# Patient Record
Sex: Female | Born: 1960 | Race: White | Hispanic: No | Marital: Married | Smoking: Former smoker
Health system: Southern US, Community
[De-identification: ages and names within clinical notes are randomized; demographics above are authoritative.]

## PROBLEM LIST (undated history)

## (undated) DIAGNOSIS — I10 Essential (primary) hypertension: Secondary | ICD-10-CM

## (undated) DIAGNOSIS — Z1371 Encounter for nonprocreative screening for genetic disease carrier status: Secondary | ICD-10-CM

## (undated) DIAGNOSIS — R7303 Prediabetes: Secondary | ICD-10-CM

## (undated) DIAGNOSIS — J309 Allergic rhinitis, unspecified: Secondary | ICD-10-CM

## (undated) DIAGNOSIS — Z8041 Family history of malignant neoplasm of ovary: Secondary | ICD-10-CM

## (undated) DIAGNOSIS — J45909 Unspecified asthma, uncomplicated: Secondary | ICD-10-CM

## (undated) HISTORY — DX: Family history of malignant neoplasm of ovary: Z80.41

## (undated) HISTORY — DX: Allergic rhinitis, unspecified: J30.9

## (undated) HISTORY — DX: Unspecified asthma, uncomplicated: J45.909

## (undated) HISTORY — DX: Encounter for nonprocreative screening for genetic disease carrier status: Z13.71

## (undated) HISTORY — DX: Essential (primary) hypertension: I10

---
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
## 1983-06-28 HISTORY — PX: LYMPHADENECTOMY: SHX5960

## 1987-06-28 HISTORY — PX: TMJ ARTHROPLASTY: SHX1066

## 2011-12-09 ENCOUNTER — Ambulatory Visit: Payer: Self-pay | Admitting: Unknown Physician Specialty

## 2014-06-27 DIAGNOSIS — Z1371 Encounter for nonprocreative screening for genetic disease carrier status: Secondary | ICD-10-CM

## 2014-06-27 DIAGNOSIS — Z8041 Family history of malignant neoplasm of ovary: Secondary | ICD-10-CM

## 2014-06-27 HISTORY — DX: Encounter for nonprocreative screening for genetic disease carrier status: Z13.71

## 2014-06-27 HISTORY — DX: Family history of malignant neoplasm of ovary: Z80.41

## 2016-05-18 ENCOUNTER — Other Ambulatory Visit: Payer: Self-pay | Admitting: Obstetrics and Gynecology

## 2016-05-18 DIAGNOSIS — Z1231 Encounter for screening mammogram for malignant neoplasm of breast: Secondary | ICD-10-CM

## 2016-05-31 ENCOUNTER — Encounter: Payer: Self-pay | Admitting: Radiology

## 2016-05-31 ENCOUNTER — Ambulatory Visit
Admission: RE | Admit: 2016-05-31 | Discharge: 2016-05-31 | Disposition: A | Discharge status: Home / Self Care | Payer: Managed Care, Other (non HMO) | Source: Ambulatory Visit | Attending: Obstetrics and Gynecology | Admitting: Obstetrics and Gynecology

## 2016-05-31 DIAGNOSIS — Z1231 Encounter for screening mammogram for malignant neoplasm of breast: Secondary | ICD-10-CM | POA: Diagnosis present

## 2016-06-27 HISTORY — PX: COLONOSCOPY: SHX174

## 2017-06-27 DIAGNOSIS — R7303 Prediabetes: Secondary | ICD-10-CM

## 2017-06-27 HISTORY — DX: Prediabetes: R73.03

## 2017-10-30 ENCOUNTER — Encounter: Payer: Self-pay | Admitting: Obstetrics and Gynecology

## 2017-10-30 ENCOUNTER — Ambulatory Visit (INDEPENDENT_AMBULATORY_CARE_PROVIDER_SITE_OTHER): Payer: Managed Care, Other (non HMO) | Admitting: Obstetrics and Gynecology

## 2017-10-30 VITALS — BP 142/90 | HR 78 | Ht 64.0 in | Wt 197.0 lb

## 2017-10-30 DIAGNOSIS — Z01419 Encounter for gynecological examination (general) (routine) without abnormal findings: Secondary | ICD-10-CM

## 2017-10-30 DIAGNOSIS — Z1239 Encounter for other screening for malignant neoplasm of breast: Secondary | ICD-10-CM

## 2017-10-30 DIAGNOSIS — N952 Postmenopausal atrophic vaginitis: Secondary | ICD-10-CM | POA: Diagnosis not present

## 2017-10-30 DIAGNOSIS — R030 Elevated blood-pressure reading, without diagnosis of hypertension: Secondary | ICD-10-CM

## 2017-10-30 DIAGNOSIS — Z1151 Encounter for screening for human papillomavirus (HPV): Secondary | ICD-10-CM | POA: Diagnosis not present

## 2017-10-30 DIAGNOSIS — Z13220 Encounter for screening for lipoid disorders: Secondary | ICD-10-CM

## 2017-10-30 DIAGNOSIS — Z131 Encounter for screening for diabetes mellitus: Secondary | ICD-10-CM

## 2017-10-30 DIAGNOSIS — Z0000 Encounter for general adult medical examination without abnormal findings: Secondary | ICD-10-CM

## 2017-10-30 DIAGNOSIS — Z124 Encounter for screening for malignant neoplasm of cervix: Secondary | ICD-10-CM

## 2017-10-30 DIAGNOSIS — Z23 Encounter for immunization: Secondary | ICD-10-CM | POA: Diagnosis not present

## 2017-10-30 DIAGNOSIS — I10 Essential (primary) hypertension: Secondary | ICD-10-CM | POA: Insufficient documentation

## 2017-10-30 DIAGNOSIS — Z1231 Encounter for screening mammogram for malignant neoplasm of breast: Secondary | ICD-10-CM | POA: Diagnosis not present

## 2017-10-30 MED ORDER — ESTRADIOL 0.1 MG/GM VA CREA: 1.0000 | TOPICAL_CREAM | Freq: Every day | VAGINAL | 1 refills | Status: DC

## 2017-10-30 NOTE — Patient Instructions (Addendum)
I value your feedback and entrusting us with your care. If you get a [facility] patient survey, I would appreciate you taking the time to let us know about your experience today. Thank you!  [facility]: [phone]  [facility]: [phone]  

## 2017-10-30 NOTE — Progress Notes (Signed)
PCP: System, Pcp Not In   Chief Complaint  Patient presents with  . Gynecologic Exam    HPI:      [name] is a 57 y.o. [phone] who LMP was No LMP recorded. Patient is postmenopausal., presents today for her annual examination.  Her menses are absent due to menopause. She does not have intermenstrual bleeding. She does have vasomotor sx.   Sex activity: single partner, contraception - post menopausal status. She does have vaginal dryness and uses estrace crm with sx improvement.  Last Pap: April 17, 2015  Results were: no abnormalities /neg HPV DNA.  Hx of STDs: none  Last mammogram: May 31, 2016  Results were: normal--routine follow-up in 12 months There is no FH of breast cancer. There is a FH of ovarian cancer in her PGM. Pt is MyRisk neg 2016.  The patient does do self-breast exams.  Colonoscopy: colonoscopy 1 years ago without abnormalities.  Repeat due after 5 years due to hx of polyps in past.  Tobacco use: The patient denies current or previous tobacco use. Alcohol use: social drinker Exercise: moderately active  She does get adequate calcium and Vitamin D in her diet.  Had borderline lipids 11/17. Neg DM screen then. Due for repeat labs but not fasting today.  Has elevated BP today. Takes at home and is in 130s/80s.    Past Medical History:  Diagnosis Date  . Allergic rhinitis   . Asthma   . BRCA negative 2016   MyRisk neg  . Family history of ovarian cancer 2016   MyRisk neg    Past Surgical History:  Procedure Laterality Date  . CESAREAN SECTION  1991/1992  . LYMPHADENECTOMY  1985  . TMJ ARTHROPLASTY  1989    Family History  Problem Relation Age of Onset  . Prostate cancer Father   . Skin cancer Father   . Colon cancer Father   . Skin cancer Brother   . Lung cancer Maternal Grandfather   . Ovarian cancer Paternal Grandmother   . Breast cancer Neg Hx     Social History   Socioeconomic History  . Marital status: Married   Spouse name: Not on file  . Number of children: Not on file  . Years of education: Not on file  . Highest education level: Not on file  Occupational History  . Not on file  Social Needs  . Financial resource strain: Not on file  . Food insecurity:    Worry: Not on file    Inability: Not on file  . Transportation needs:    Medical: Not on file    Non-medical: Not on file  Tobacco Use  . Smoking status: Never Smoker  . Smokeless tobacco: Never Used  Substance and Sexual Activity  . Alcohol use: Yes    Comment: ocasionally   . Drug use: Never  . Sexual activity: Yes    Birth control/protection: Post-menopausal  Lifestyle  . Physical activity:    Days per week: Not on file    Minutes per session: Not on file  . Stress: Not on file  Relationships  . Social connections:    Talks on phone: Not on file    Gets together: Not on file    Attends religious service: Not on file    Active member of club or organization: Not on file    Attends meetings of clubs or organizations: Not on file    Relationship status: Not on file  .  Intimate partner violence:    Fear of current or ex partner: Not on file    Emotionally abused: Not on file    Physically abused: Not on file    Forced sexual activity: Not on file  Other Topics Concern  . Not on file  Social History Narrative  . Not on file    Outpatient Medications Prior to Visit  Medication Sig Dispense Refill  . albuterol (PROVENTIL HFA) 108 (90 Base) MCG/ACT inhaler Inhale into the lungs.     No facility-administered medications prior to visit.     ROS:  Review of Systems  Constitutional: Negative for fatigue, fever and unexpected weight change.  Respiratory: Negative for cough, shortness of breath and wheezing.   Cardiovascular: Negative for chest pain, palpitations and leg swelling.  Gastrointestinal: Negative for blood in stool, constipation, diarrhea, nausea and vomiting.  Endocrine: Negative for cold intolerance, heat  intolerance and polyuria.  Genitourinary: Negative for dyspareunia, dysuria, flank pain, frequency, genital sores, hematuria, menstrual problem, pelvic pain, urgency, vaginal bleeding, vaginal discharge and vaginal pain.  Musculoskeletal: Negative for back pain, joint swelling and myalgias.  Skin: Negative for rash.  Neurological: Negative for dizziness, syncope, light-headedness, numbness and headaches.  Hematological: Negative for adenopathy.  Psychiatric/Behavioral: Negative for agitation, confusion, sleep disturbance and suicidal ideas. The patient is not nervous/anxious.    BREAST: No symptoms    Objective: BP (!) 142/90   Pulse 78   Ht '5\' 4"'  (1.626 m)   Wt 197 lb (89.4 kg)   BMI 33.81 kg/m    Physical Exam  Constitutional: She is oriented to person, place, and time. She appears well-developed and well-nourished.  Genitourinary: Vagina normal and uterus normal. There is no rash or tenderness on the right labia. There is no rash or tenderness on the left labia. No erythema or tenderness in the vagina. No vaginal discharge found. Right adnexum does not display mass and does not display tenderness. Left adnexum does not display mass and does not display tenderness. Cervix does not exhibit motion tenderness or polyp. Uterus is not enlarged or tender.  Neck: Normal range of motion. No thyromegaly present.  Cardiovascular: Normal rate, regular rhythm and normal heart sounds.  No murmur heard. Pulmonary/Chest: Effort normal and breath sounds normal. Right breast exhibits no mass, no nipple discharge, no skin change and no tenderness. Left breast exhibits no mass, no nipple discharge, no skin change and no tenderness.  Abdominal: Soft. There is no tenderness. There is no guarding.  Musculoskeletal: Normal range of motion.  Neurological: She is alert and oriented to person, place, and time. No cranial nerve deficit.  Psychiatric: She has a normal mood and affect. Her behavior is normal.    Vitals reviewed.   Assessment/Plan:  Encounter for annual routine gynecological examination  Cervical cancer screening - Plan: IGP, Aptima HPV  Screening for HPV (human papillomavirus) - Plan: IGP, Aptima HPV  Screening for breast cancer - Pt to sched mammo - Plan: MM DIGITAL SCREENING BILATERAL  Postmenopausal atrophic vaginitis - Rx RF estrace crm.  - Plan: estradiol (ESTRACE) 0.1 MG/GM vaginal cream  Need for Tdap vaccination - Plan: Tdap vaccine greater than or equal to 7yo IM  Blood tests for routine general physical examination - Will call wiht results. - Plan: Comprehensive metabolic panel, Lipid panel, Hemoglobin A1c  Screening cholesterol level - Plan: Lipid panel  Screening for diabetes mellitus - Plan: Hemoglobin A1c  Elevated blood pressure reading in office without diagnosis of hypertension - Rechk when  RTO for labs. No hx of HTN.    Meds ordered this encounter  Medications  . estradiol (ESTRACE) 0.1 MG/GM vaginal cream    Sig: Place 1 Applicatorful vaginally at bedtime. Insert 1g nightly for 1 wk, then 1 g once weekly as maintenace    Dispense:  42.5 g    Refill:  1    Order Specific Question:   Supervising Provider    Answer:   Gae Dry [[id number]]           GYN counsel breast self exam, mammography screening, menopause, adequate intake of calcium and vitamin D, diet and exercise    F/U  Return in about 1 year (around 10/31/2018).  Alicia B. Copland, PA-C 10/30/2017 12:15 PM

## 2017-11-02 ENCOUNTER — Other Ambulatory Visit (INDEPENDENT_AMBULATORY_CARE_PROVIDER_SITE_OTHER): Payer: Managed Care, Other (non HMO)

## 2017-11-02 VITALS — BP 160/100

## 2017-11-02 DIAGNOSIS — Z131 Encounter for screening for diabetes mellitus: Secondary | ICD-10-CM

## 2017-11-02 DIAGNOSIS — Z0000 Encounter for general adult medical examination without abnormal findings: Secondary | ICD-10-CM

## 2017-11-02 DIAGNOSIS — R030 Elevated blood-pressure reading, without diagnosis of hypertension: Secondary | ICD-10-CM

## 2017-11-02 DIAGNOSIS — Z13220 Encounter for screening for lipoid disorders: Secondary | ICD-10-CM

## 2017-11-02 LAB — IGP, APTIMA HPV
HPV Aptima: NEGATIVE
PAP SMEAR COMMENT: 0

## 2017-11-02 NOTE — Progress Notes (Signed)
    System, Pcp Not In   Chief Complaint  Patient presents with  . Follow-up    BP recheck with nurse at lab draw    HPI:      [name] is a 57 y.o. [phone] who LMP was No LMP recorded. Patient is postmenopausal., presents today for BP rechk with nurse due to elevated reading at 10/30/17 appt, no hx of HTN. Pt states she didn't sleep well last night.  10/30/17: BP 142/90  OBJECTIVE:   Vitals:  BP (!) 160/100    Assessment/Plan: Elevated blood pressure reading in office without diagnosis of hypertension - Level increase but pt didn't sleep last night. RTO in 1 wk for rechk. Keep BPs at home.   Blood tests for routine general physical examination - Will call wiht results. - Plan: Comprehensive metabolic panel, Lipid panel, Hemoglobin A1c  Screening cholesterol level - Plan: Lipid panel  Screening for diabetes mellitus - Plan: Hemoglobin A1c    Return in about 1 week (around 11/09/2017) for BP check with me.  Diahann Guajardo B. Falcon Mccaskey, PA-C 11/02/2017 10:38 AM

## 2017-11-02 NOTE — Patient Instructions (Signed)
I value your feedback and entrusting us with your care. If you get a [facility] patient survey, I would appreciate you taking the time to let us know about your experience today. Thank you! 

## 2017-11-03 ENCOUNTER — Encounter: Payer: Self-pay | Admitting: Obstetrics and Gynecology

## 2017-11-03 LAB — COMPREHENSIVE METABOLIC PANEL
ALK PHOS: 83 IU/L (ref 39–117)
ALT: 20 IU/L (ref 0–32)
AST: 20 IU/L (ref 0–40)
Albumin/Globulin Ratio: 2.4 — ABNORMAL HIGH (ref 1.2–2.2)
Albumin: 4.6 g/dL (ref 3.5–5.5)
BILIRUBIN TOTAL: 0.4 mg/dL (ref 0.0–1.2)
BUN/Creatinine Ratio: 17 (ref 9–23)
BUN: 13 mg/dL (ref 6–24)
CHLORIDE: 103 mmol/L (ref 96–106)
CO2: 21 mmol/L (ref 20–29)
CREATININE: 0.78 mg/dL (ref 0.57–1.00)
Calcium: 9.3 mg/dL (ref 8.7–10.2)
GFR calc Af Amer: 98 mL/min/{1.73_m2} (ref >59)
GFR calc non Af Amer: 85 mL/min/{1.73_m2} (ref >59)
GLUCOSE: 102 mg/dL — AB (ref 65–99)
Globulin, Total: 1.9 g/dL (ref 1.5–4.5)
Potassium: 4.5 mmol/L (ref 3.5–5.2)
Sodium: 142 mmol/L (ref 134–144)
Total Protein: 6.5 g/dL (ref 6.0–8.5)

## 2017-11-03 LAB — LIPID PANEL
CHOLESTEROL TOTAL: 206 mg/dL — AB (ref 100–199)
Chol/HDL Ratio: 3.9 ratio (ref 0.0–4.4)
HDL: 53 mg/dL (ref >39)
LDL Calculated: 133 mg/dL — ABNORMAL HIGH (ref 0–99)
TRIGLYCERIDES: 99 mg/dL (ref 0–149)
VLDL CHOLESTEROL CAL: 20 mg/dL (ref 5–40)

## 2017-11-03 LAB — HEMOGLOBIN A1C
Est. average glucose Bld gHb Est-mCnc: 117 mg/dL
Hgb A1c MFr Bld: 5.7 % — ABNORMAL HIGH (ref 4.8–5.6)

## 2017-11-07 ENCOUNTER — Encounter: Payer: Self-pay | Admitting: Obstetrics and Gynecology

## 2017-11-07 ENCOUNTER — Ambulatory Visit (INDEPENDENT_AMBULATORY_CARE_PROVIDER_SITE_OTHER): Payer: Managed Care, Other (non HMO) | Admitting: Obstetrics and Gynecology

## 2017-11-07 VITALS — BP 156/96 | HR 72 | Ht 64.0 in | Wt 197.0 lb

## 2017-11-07 DIAGNOSIS — I10 Essential (primary) hypertension: Secondary | ICD-10-CM | POA: Insufficient documentation

## 2017-11-07 MED ORDER — LISINOPRIL 10 MG PO TABS: 10.0000 mg | ORAL_TABLET | Freq: Every day | ORAL | 0 refills | Status: DC

## 2017-11-07 NOTE — Progress Notes (Signed)
System, Pcp Not In   Chief Complaint  Patient presents with  . Follow-up    blood pressure ck    HPI:      [name] is a 57 y.o. [phone] who LMP was No LMP recorded. Patient is postmenopausal., presents today for BP check. BP was 142/90 at 10/30/17 annual. Pt had rechk with lab draw 11/02/17 and it was 160/100 (pt hadn't slept well night before). Pt has been taking BPs at home and states machine gives her different readings but it has been "high" (138-155/76-83 yesterday, 160s/100 other times). Pt has gone to decaf coffee. FH HTN. Pt denies SOB, CP, headaches, vision changes.   Past Medical History:  Diagnosis Date  . Allergic rhinitis   . Asthma   . BRCA negative 2016   MyRisk neg  . Family history of ovarian cancer 2016   MyRisk neg    Past Surgical History:  Procedure Laterality Date  . CESAREAN SECTION  1991/1992  . LYMPHADENECTOMY  1985  . TMJ ARTHROPLASTY  1989    Family History  Problem Relation Age of Onset  . Prostate cancer Father   . Skin cancer Father   . Colon cancer Father   . Hypertension Father   . Skin cancer Brother   . Lung cancer Maternal Grandfather   . Ovarian cancer Paternal Grandmother   . Hypertension Mother   . Hypertension Sister   . Breast cancer Neg Hx     Social History   Socioeconomic History  . Marital status: Married    Spouse name: Not on file  . Number of children: Not on file  . Years of education: Not on file  . Highest education level: Not on file  Occupational History  . Not on file  Social Needs  . Financial resource strain: Not on file  . Food insecurity:    Worry: Not on file    Inability: Not on file  . Transportation needs:    Medical: Not on file    Non-medical: Not on file  Tobacco Use  . Smoking status: Former Research scientist (life sciences)  . Smokeless tobacco: Never Used  . Tobacco comment: quit ~ 2000  Substance and Sexual Activity  . Alcohol use: Yes    Comment: ocasionally   . Drug use: Never  . Sexual  activity: Yes    Birth control/protection: Post-menopausal  Lifestyle  . Physical activity:    Days per week: Not on file    Minutes per session: Not on file  . Stress: Not on file  Relationships  . Social connections:    Talks on phone: Not on file    Gets together: Not on file    Attends religious service: Not on file    Active member of club or organization: Not on file    Attends meetings of clubs or organizations: Not on file    Relationship status: Not on file  . Intimate partner violence:    Fear of current or ex partner: Not on file    Emotionally abused: Not on file    Physically abused: Not on file    Forced sexual activity: Not on file  Other Topics Concern  . Not on file  Social History Narrative  . Not on file    Outpatient Medications Prior to Visit  Medication Sig Dispense Refill  . albuterol (PROVENTIL HFA) 108 (90 Base) MCG/ACT inhaler Inhale into the lungs.    Marland Kitchen estradiol (ESTRACE) 0.1 MG/GM vaginal cream  Place 1 Applicatorful vaginally at bedtime. Insert 1g nightly for 1 wk, then 1 g once weekly as maintenace 42.5 g 1   No facility-administered medications prior to visit.      ROS:  Review of Systems  Constitutional: Negative for fever.  Gastrointestinal: Negative for blood in stool, constipation, diarrhea, nausea and vomiting.  Genitourinary: Negative for dyspareunia, dysuria, flank pain, frequency, hematuria, urgency, vaginal bleeding, vaginal discharge and vaginal pain.  Musculoskeletal: Negative for back pain.  Skin: Negative for rash.  Neurological: Negative for dizziness, light-headedness and headaches.   BREAST: No symptoms   OBJECTIVE:   Vitals:  BP (!) 156/96   Pulse 72   Ht '5\' 4"'  (1.626 m)   Wt 197 lb (89.4 kg)   BMI 33.81 kg/m   Physical Exam  Constitutional: She is oriented to person, place, and time. She appears well-developed.  Neck: Normal range of motion.  Pulmonary/Chest: Effort normal.  Musculoskeletal: Normal range of  motion.  Neurological: She is alert and oriented to person, place, and time. No cranial nerve deficit.  Psychiatric: She has a normal mood and affect. Her behavior is normal. Judgment and thought content normal.   Assessment/Plan: Essential hypertension - Start lisinopril. Side effects/ACEI cough discussed. RTO in 4 wks for BP chck/CMP. Chk BPs at home. Diet/exercise/wt loss/DASH diet changes.  - Plan: lisinopril (PRINIVIL) 10 MG tablet   Meds ordered this encounter  Medications  . lisinopril (PRINIVIL) 10 MG tablet    Sig: Take 1 tablet (10 mg total) by mouth daily.    Dispense:  30 tablet    Refill:  0    Order Specific Question:   Supervising Provider    Answer:   Gae Dry [[id number]]     Return in about 1 month (around 12/05/2017) for BP f/u.  Peirce Deveney B. Amiylah Anastos, PA-C 11/07/2017 11:11 AM

## 2017-11-07 NOTE — Patient Instructions (Signed)
I value your feedback and entrusting us with your care. If you get a [facility] patient survey, I would appreciate you taking the time to let us know about your experience today. Thank you! 

## 2017-11-29 ENCOUNTER — Other Ambulatory Visit: Payer: Self-pay | Admitting: Obstetrics and Gynecology

## 2017-11-29 DIAGNOSIS — I10 Essential (primary) hypertension: Secondary | ICD-10-CM

## 2017-11-29 NOTE — Telephone Encounter (Signed)
Please advise. Thank you

## 2017-12-05 ENCOUNTER — Encounter: Payer: Self-pay | Admitting: Obstetrics and Gynecology

## 2017-12-05 ENCOUNTER — Ambulatory Visit (INDEPENDENT_AMBULATORY_CARE_PROVIDER_SITE_OTHER): Payer: Managed Care, Other (non HMO) | Admitting: Obstetrics and Gynecology

## 2017-12-05 VITALS — BP 140/80 | Ht 64.0 in | Wt 198.0 lb

## 2017-12-05 DIAGNOSIS — I10 Essential (primary) hypertension: Secondary | ICD-10-CM | POA: Diagnosis not present

## 2017-12-05 MED ORDER — LISINOPRIL 10 MG PO TABS: 10.0000 mg | ORAL_TABLET | Freq: Every day | ORAL | 0 refills | Status: DC

## 2017-12-05 NOTE — Progress Notes (Signed)
System, Pcp Not In   Chief Complaint  Patient presents with  . Blood Pressure Check    HPI:      Lauren Vasquez is a 57 y.o. [phone] who LMP was No LMP recorded. Patient is postmenopausal., presents today for BP check. Diagnosed with HTN 5/19 after 2 visits with elevated BP. Started on lisinopril 10 mg about a month ago. Doing well. No side effects except occas feels dizzy. No ACEI cough. Taking BP at home and it's 120-145/70-85.  Pt lives in [place] and is going back in a couple wks. Doesn't have provider there but could see someone there. Gets meds in [place] and mailed to her.    Past Medical History:  Diagnosis Date  . Allergic rhinitis   . Asthma   . BRCA negative 2016   MyRisk neg  . Family history of ovarian cancer 2016   MyRisk neg    Past Surgical History:  Procedure Laterality Date  . CESAREAN SECTION  1991/1992  . LYMPHADENECTOMY  1985  . TMJ ARTHROPLASTY  1989    Family History  Problem Relation Age of Onset  . Prostate cancer Father   . Skin cancer Father   . Colon cancer Father   . Hypertension Father   . Skin cancer Brother   . Lung cancer Maternal Grandfather   . Ovarian cancer Paternal Grandmother   . Hypertension Mother   . Hypertension Sister   . Breast cancer Neg Hx     Social History   Socioeconomic History  . Marital status: Married    Spouse name: Not on file  . Number of children: Not on file  . Years of education: Not on file  . Highest education level: Not on file  Occupational History  . Not on file  Social Needs  . Financial resource strain: Not on file  . Food insecurity:    Worry: Not on file    Inability: Not on file  . Transportation needs:    Medical: Not on file    Non-medical: Not on file  Tobacco Use  . Smoking status: Former Research scientist (life sciences)  . Smokeless tobacco: Never Used  . Tobacco comment: quit ~ 2000  Substance and Sexual Activity  . Alcohol use: Yes    Comment: ocasionally   . Drug use: Never  . Sexual activity:  Yes    Birth control/protection: Post-menopausal  Lifestyle  . Physical activity:    Days per week: Not on file    Minutes per session: Not on file  . Stress: Not on file  Relationships  . Social connections:    Talks on phone: Not on file    Gets together: Not on file    Attends religious service: Not on file    Active member of club or organization: Not on file    Attends meetings of clubs or organizations: Not on file    Relationship status: Not on file  . Intimate partner violence:    Fear of current or ex partner: Not on file    Emotionally abused: Not on file    Physically abused: Not on file    Forced sexual activity: Not on file  Other Topics Concern  . Not on file  Social History Narrative  . Not on file    Outpatient Medications Prior to Visit  Medication Sig Dispense Refill  . albuterol (PROVENTIL HFA) 108 (90 Base) MCG/ACT inhaler Inhale into the lungs.    Marland Kitchen estradiol (ESTRACE) 0.1  MG/GM vaginal cream Place 1 Applicatorful vaginally at bedtime. Insert 1g nightly for 1 wk, then 1 g once weekly as maintenace 42.5 g 1  . lisinopril (PRINIVIL,ZESTRIL) 10 MG tablet TAKE 1 TABLET BY MOUTH EVERY DAY 30 tablet 0   No facility-administered medications prior to visit.     ROS:  Review of Systems  Constitutional: Negative for fever.  Gastrointestinal: Negative for blood in stool, constipation, diarrhea, nausea and vomiting.  Genitourinary: Negative for dyspareunia, dysuria, flank pain, frequency, hematuria, urgency, vaginal bleeding, vaginal discharge and vaginal pain.  Musculoskeletal: Negative for back pain.  Skin: Negative for rash.  Neurological: Positive for dizziness. Negative for syncope, numbness and headaches.    OBJECTIVE:   Vitals:  BP 140/80   Ht '5\' 4"'  (1.626 m)   Wt 198 lb (89.8 kg)   BMI 33.99 kg/m   Physical Exam  Constitutional: She is oriented to person, place, and time. She appears well-developed.  Neck: Normal range of motion.    Pulmonary/Chest: Effort normal.  Musculoskeletal: Normal range of motion.  Neurological: She is alert and oriented to person, place, and time. No cranial nerve deficit.  Psychiatric: She has a normal mood and affect. Her behavior is normal. Judgment and thought content normal.  Vitals reviewed.   Assessment/Plan: Essential hypertension - Plan: Comprehensive metabolic panel, lisinopril (PRINIVIL,ZESTRIL) 10 MG tablet  BP improved on lisinopril. Given improved readings and pt going back to [place] in a couple wks, cont current dose. Pt to take BPs at home and f/u with me re: readings in a couple wks. If still elevated, will increase dose. Rx RF lisinopril. CMP today.   Meds ordered this encounter  Medications  . lisinopril (PRINIVIL,ZESTRIL) 10 MG tablet    Sig: Take 1 tablet (10 mg total) by mouth daily.    Dispense:  90 tablet    Refill:  0    Order Specific Question:   Supervising Provider    Answer:   Gae Dry [[id number]]      Return if symptoms worsen or fail to improve.  Alicia B. Copland, PA-C 12/05/2017 2:53 PM

## 2017-12-05 NOTE — Patient Instructions (Signed)
I value your feedback and entrusting us with your care. If you get a [facility] patient survey, I would appreciate you taking the time to let us know about your experience today. Thank you! 

## 2017-12-06 LAB — COMPREHENSIVE METABOLIC PANEL
ALT: 23 IU/L (ref 0–32)
AST: 19 IU/L (ref 0–40)
Albumin/Globulin Ratio: 2.2 (ref 1.2–2.2)
Albumin: 4.6 g/dL (ref 3.5–5.5)
Alkaline Phosphatase: 78 IU/L (ref 39–117)
BILIRUBIN TOTAL: 0.4 mg/dL (ref 0.0–1.2)
BUN/Creatinine Ratio: 18 (ref 9–23)
BUN: 13 mg/dL (ref 6–24)
CO2: 24 mmol/L (ref 20–29)
Calcium: 9.3 mg/dL (ref 8.7–10.2)
Chloride: 102 mmol/L (ref 96–106)
Creatinine, Ser: 0.72 mg/dL (ref 0.57–1.00)
GFR calc non Af Amer: 94 mL/min/{1.73_m2} (ref >59)
GFR, EST AFRICAN AMERICAN: 108 mL/min/{1.73_m2} (ref >59)
Globulin, Total: 2.1 g/dL (ref 1.5–4.5)
Glucose: 117 mg/dL — ABNORMAL HIGH (ref 65–99)
Potassium: 4 mmol/L (ref 3.5–5.2)
Sodium: 142 mmol/L (ref 134–144)
TOTAL PROTEIN: 6.7 g/dL (ref 6.0–8.5)

## 2018-03-15 ENCOUNTER — Encounter: Payer: Self-pay | Admitting: Obstetrics and Gynecology

## 2018-03-20 ENCOUNTER — Other Ambulatory Visit: Payer: Self-pay | Admitting: Obstetrics and Gynecology

## 2018-03-20 DIAGNOSIS — I10 Essential (primary) hypertension: Secondary | ICD-10-CM

## 2018-03-20 NOTE — Telephone Encounter (Signed)
Please advise 

## 2018-03-29 ENCOUNTER — Encounter: Payer: Self-pay | Admitting: Obstetrics and Gynecology

## 2018-04-02 ENCOUNTER — Other Ambulatory Visit: Payer: Self-pay | Admitting: Obstetrics and Gynecology

## 2018-04-02 DIAGNOSIS — I10 Essential (primary) hypertension: Secondary | ICD-10-CM

## 2018-04-02 MED ORDER — LISINOPRIL 10 MG PO TABS: 10.0000 mg | ORAL_TABLET | Freq: Every day | ORAL | 1 refills | Status: DC

## 2018-04-02 NOTE — Progress Notes (Signed)
Rx RF lisinopril. Bps in 130s/80s.

## 2018-04-09 ENCOUNTER — Encounter: Payer: Self-pay | Admitting: Primary Care

## 2018-04-09 ENCOUNTER — Ambulatory Visit (INDEPENDENT_AMBULATORY_CARE_PROVIDER_SITE_OTHER): Payer: Managed Care, Other (non HMO) | Admitting: Primary Care

## 2018-04-09 VITALS — BP 122/80 | HR 82 | Temp 98.2°F | Ht 63.5 in | Wt 198.0 lb

## 2018-04-09 DIAGNOSIS — J4520 Mild intermittent asthma, uncomplicated: Secondary | ICD-10-CM

## 2018-04-09 DIAGNOSIS — J45909 Unspecified asthma, uncomplicated: Secondary | ICD-10-CM | POA: Insufficient documentation

## 2018-04-09 DIAGNOSIS — R7303 Prediabetes: Secondary | ICD-10-CM | POA: Insufficient documentation

## 2018-04-09 DIAGNOSIS — N952 Postmenopausal atrophic vaginitis: Secondary | ICD-10-CM | POA: Diagnosis not present

## 2018-04-09 DIAGNOSIS — M79673 Pain in unspecified foot: Secondary | ICD-10-CM | POA: Insufficient documentation

## 2018-04-09 DIAGNOSIS — I10 Essential (primary) hypertension: Secondary | ICD-10-CM | POA: Diagnosis not present

## 2018-04-09 DIAGNOSIS — M79672 Pain in left foot: Secondary | ICD-10-CM

## 2018-04-09 LAB — POCT GLYCOSYLATED HEMOGLOBIN (HGB A1C): Hemoglobin A1C: 5.6 % (ref 4.0–5.6)

## 2018-04-09 NOTE — Assessment & Plan Note (Signed)
Managed on Estrace cream and doing well, continue same.

## 2018-04-09 NOTE — Assessment & Plan Note (Signed)
Noted on labs from May 2019, repeat A1C pending. Discussed to work on diet and regular exercise.

## 2018-04-09 NOTE — Assessment & Plan Note (Signed)
Infrequent use of albuterol inhaler. No wheezing on exam.  Continue to monitor. 

## 2018-04-09 NOTE — Assessment & Plan Note (Signed)
Stable in the office today, continue lisinopril. BMP from June 2019 stable.

## 2018-04-09 NOTE — Patient Instructions (Signed)
Stop by the lab prior to leaving today. I will notify you of your results once received.   Start exercising. You should be getting 150 minutes of moderate intensity exercise weekly.  It's important to improve your diet by reducing consumption of fast food, fried food, processed snack foods, sugary drinks. Increase consumption of fresh vegetables and fruits, whole grains, water.  Ensure you are drinking 64 ounces of water daily.  It was a pleasure to meet you today! Please don't hesitate to call or message me with any questions. Welcome to [facility]!   

## 2018-04-09 NOTE — Assessment & Plan Note (Signed)
Located to ball of left foot x 2 months, overall better today. Suspect inflammation to joint or tendon involvement. Discussed to add in insoles to shoes, Ibuprofen PRN, stretching of the tendon. She will update if symptoms persist, consider podiatry evaluation.

## 2018-04-09 NOTE — Progress Notes (Signed)
Subjective:    Patient ID: Lauren Vasquez, female    DOB: October 20, 1960, 57 y.o.   MRN: [id number]  HPI  Lauren Vasquez is a 57 year old female who presents today to establish care and discuss the problems mentioned below. Will obtain old records.  1) Vaginal Atrophy: Currently managed on estradiol 0.1 mg/gm vaginal cream for which she uses once weekly on average. She is following with GYN.   2) Essential Hypertension: Currently managed on lisinopril 10 mg. She denies chest pain, shortness of breath, cough.   BP Readings from Last 3 Encounters:  04/09/18 122/80  12/05/17 140/80  11/07/17 (!) 156/96   3) Asthma/Allergic Rhinitis: Diagnosed years ago. She is managed on albuterol HFA for which she uses infrequently during Spring seasons. She was once managed on Singulair, overall does well without as she lives in [place].   4) Left Foot Pain: Located to the plantar surface of the ball of her foot x 2 months. Overall today she's noticed improvement. She's tried taking Tylenol, ice, and massage with temporary improvement. She does have a history of plantar fasciitis, this doesn't feel similar.    Review of Systems  Eyes: Negative for visual disturbance.  Respiratory: Negative for cough, shortness of breath and wheezing.   Cardiovascular: Negative for chest pain.  Musculoskeletal:       Left foot pain  Allergic/Immunologic: Positive for environmental allergies.  Neurological: Negative for dizziness and headaches.       Past Medical History:  Diagnosis Date  . Allergic rhinitis   . Asthma   . BRCA negative 2016   MyRisk neg  . Essential hypertension   . Family history of ovarian cancer 2016   MyRisk neg     Social History   Socioeconomic History  . Marital status: Married    Spouse name: Not on file  . Number of children: Not on file  . Years of education: Not on file  . Highest education level: Not on file  Occupational History  . Not on file  Social Needs  . Financial resource  strain: Not on file  . Food insecurity:    Worry: Not on file    Inability: Not on file  . Transportation needs:    Medical: Not on file    Non-medical: Not on file  Tobacco Use  . Smoking status: Former Research scientist (life sciences)  . Smokeless tobacco: Never Used  . Tobacco comment: quit ~ 2000  Substance and Sexual Activity  . Alcohol use: Yes    Comment: ocasionally   . Drug use: Never  . Sexual activity: Yes    Birth control/protection: Post-menopausal  Lifestyle  . Physical activity:    Days per week: Not on file    Minutes per session: Not on file  . Stress: Not on file  Relationships  . Social connections:    Talks on phone: Not on file    Gets together: Not on file    Attends religious service: Not on file    Active member of club or organization: Not on file    Attends meetings of clubs or organizations: Not on file    Relationship status: Not on file  . Intimate partner violence:    Fear of current or ex partner: Not on file    Emotionally abused: Not on file    Physically abused: Not on file    Forced sexual activity: Not on file  Other Topics Concern  . Not on file  Social History  Narrative   Married.   2 children.    Lives in [place].    Enjoys traveling.     Past Surgical History:  Procedure Laterality Date  . CESAREAN SECTION  1991/1992  . LYMPHADENECTOMY  1985  . TMJ ARTHROPLASTY  1989    Family History  Problem Relation Age of Onset  . Prostate cancer Father   . Skin cancer Father   . Colon cancer Father   . Hypertension Father   . Skin cancer Brother   . Lung cancer Maternal Grandfather   . Ovarian cancer Paternal Grandmother   . Hypertension Mother   . Hypertension Sister   . Breast cancer Neg Hx     Allergies  Allergen Reactions  . Shrimp [Shellfish Allergy]   . Sulfites     Current Outpatient Medications on File Prior to Visit  Medication Sig Dispense Refill  . albuterol (PROVENTIL HFA) 108 (90 Base) MCG/ACT inhaler Inhale into the lungs.    Marland Kitchen  estradiol (ESTRACE) 0.1 MG/GM vaginal cream Place 1 Applicatorful vaginally at bedtime. Insert 1g nightly for 1 wk, then 1 g once weekly as maintenace 42.5 g 1  . lisinopril (PRINIVIL,ZESTRIL) 10 MG tablet Take 1 tablet (10 mg total) by mouth daily. 90 tablet 1   No current facility-administered medications on file prior to visit.     BP 122/80   Pulse 82   Temp 98.2 F (36.8 C) (Oral)   Ht 5' 3.5" (1.613 m)   Wt 198 lb (89.8 kg)   SpO2 98%   BMI 34.52 kg/m    Objective:   Physical Exam  Constitutional: She appears well-nourished.  Neck: Neck supple.  Cardiovascular: Normal rate and regular rhythm.  Pulses:      Dorsalis pedis pulses are 2+ on the left side.       Posterior tibial pulses are 2+ on the left side.  Respiratory: Effort normal and breath sounds normal.  Musculoskeletal:       Feet:  Discomfort to left plantar foot with moderate pressure.  Skin: Skin is warm and dry.           Assessment & Plan:

## 2018-10-01 ENCOUNTER — Other Ambulatory Visit: Payer: Self-pay | Admitting: Obstetrics and Gynecology

## 2018-10-01 DIAGNOSIS — I10 Essential (primary) hypertension: Secondary | ICD-10-CM

## 2018-10-30 ENCOUNTER — Encounter

## 2018-10-31 ENCOUNTER — Ambulatory Visit: Payer: Self-pay | Admitting: Family Medicine

## 2018-12-30 ENCOUNTER — Other Ambulatory Visit: Payer: Self-pay | Admitting: Obstetrics and Gynecology

## 2018-12-30 DIAGNOSIS — I10 Essential (primary) hypertension: Secondary | ICD-10-CM

## 2018-12-31 NOTE — Telephone Encounter (Signed)
Please advise 

## 2019-01-08 ENCOUNTER — Other Ambulatory Visit: Payer: Self-pay | Admitting: Obstetrics and Gynecology

## 2019-01-08 DIAGNOSIS — I10 Essential (primary) hypertension: Secondary | ICD-10-CM

## 2019-01-08 MED ORDER — LISINOPRIL 10 MG PO TABS: 10.0000 mg | ORAL_TABLET | Freq: Every day | ORAL | 0 refills | Status: DC

## 2019-01-08 NOTE — Progress Notes (Signed)
Rx RF lisinopril until annual.

## 2019-01-08 NOTE — Telephone Encounter (Signed)
Cont.. 

## 2019-01-08 NOTE — Telephone Encounter (Signed)
Advise

## 2019-02-13 ENCOUNTER — Other Ambulatory Visit: Payer: Self-pay

## 2019-02-13 ENCOUNTER — Ambulatory Visit (INDEPENDENT_AMBULATORY_CARE_PROVIDER_SITE_OTHER): Payer: Managed Care, Other (non HMO) | Admitting: Obstetrics and Gynecology

## 2019-02-13 ENCOUNTER — Encounter: Payer: Self-pay | Admitting: Obstetrics and Gynecology

## 2019-02-13 VITALS — BP 110/80 | Ht 63.5 in | Wt 194.6 lb

## 2019-02-13 DIAGNOSIS — Z01419 Encounter for gynecological examination (general) (routine) without abnormal findings: Secondary | ICD-10-CM

## 2019-02-13 DIAGNOSIS — Z0000 Encounter for general adult medical examination without abnormal findings: Secondary | ICD-10-CM

## 2019-02-13 DIAGNOSIS — Z1239 Encounter for other screening for malignant neoplasm of breast: Secondary | ICD-10-CM

## 2019-02-13 DIAGNOSIS — N952 Postmenopausal atrophic vaginitis: Secondary | ICD-10-CM

## 2019-02-13 DIAGNOSIS — Z131 Encounter for screening for diabetes mellitus: Secondary | ICD-10-CM

## 2019-02-13 DIAGNOSIS — Z23 Encounter for immunization: Secondary | ICD-10-CM | POA: Diagnosis not present

## 2019-02-13 DIAGNOSIS — I10 Essential (primary) hypertension: Secondary | ICD-10-CM

## 2019-02-13 DIAGNOSIS — Z13220 Encounter for screening for lipoid disorders: Secondary | ICD-10-CM

## 2019-02-13 MED ORDER — ESTRADIOL 0.1 MG/GM VA CREA: TOPICAL_CREAM | VAGINAL | 1 refills | Status: DC

## 2019-02-13 MED ORDER — LISINOPRIL 10 MG PO TABS: 10.0000 mg | ORAL_TABLET | Freq: Every day | ORAL | 3 refills | Status: DC

## 2019-02-13 NOTE — Patient Instructions (Addendum)
I value your feedback and entrusting us with your care. If you get a [facility] patient survey, I would appreciate you taking the time to let us know about your experience today. Thank you!    [facility]: [phone]  

## 2019-02-13 NOTE — Progress Notes (Signed)
PCP: Pleas Koch, NP   Chief Complaint  Patient presents with  . Gynecologic Exam    HPI:      [name] is a 58 y.o. [phone] who LMP was No LMP recorded. Patient is postmenopausal., presents today for her annual examination.  Her menses are absent due to menopause. She does not have intermenstrual bleeding. She does have vasomotor sx.   Sex activity: single partner, contraception - post menopausal status. She does have vaginal dryness and uses lubricants and estrace crm with sx improvement.  Last Pap: 10/30/17  Results were: no abnormalities /neg HPV DNA.  Hx of STDs: none  Last mammogram: 11/03/17  Results were: normal--routine follow-up in 12 months There is no FH of breast cancer. There is a FH of ovarian cancer in her PGM. Pt is MyRisk neg 2016.  The patient does do self-breast exams.  Colonoscopy: colonoscopy 2 years ago without abnormalities.  Repeat due after 5 years due to hx of polyps in past.  Tobacco use: The patient denies current or previous tobacco use. Alcohol use: social drinker Exercise: moderately active  She does get adequate calcium and Vitamin D in her diet.  Had borderline lipids 11/17, 5/19. Pre-DM 5/19. Due for repeat labs but not fasting today. Down 4# from 6/19. Had elevated BPs last yr. Started lisinopril 10 mg. BPs are good. No side effects.  Past Medical History:  Diagnosis Date  . Allergic rhinitis   . Asthma   . BRCA negative 2016   MyRisk neg  . Essential hypertension   . Family history of ovarian cancer 2016   MyRisk neg  . Pre-diabetes 2019    Past Surgical History:  Procedure Laterality Date  . CESAREAN SECTION  1991/1992  . LYMPHADENECTOMY  1985  . TMJ ARTHROPLASTY  1989    Family History  Problem Relation Age of Onset  . Prostate cancer Father   . Skin cancer Father   . Colon cancer Father   . Hypertension Father   . Skin cancer Brother   . Lung cancer Maternal Grandfather   . Ovarian cancer Paternal  Grandmother   . Hypertension Mother   . Hypertension Sister   . Breast cancer Neg Hx     Social History   Socioeconomic History  . Marital status: Married    Spouse name: Not on file  . Number of children: Not on file  . Years of education: Not on file  . Highest education level: Not on file  Occupational History  . Not on file  Social Needs  . Financial resource strain: Not on file  . Food insecurity    Worry: Not on file    Inability: Not on file  . Transportation needs    Medical: Not on file    Non-medical: Not on file  Tobacco Use  . Smoking status: Former Research scientist (life sciences)  . Smokeless tobacco: Never Used  . Tobacco comment: quit ~ 2000  Substance and Sexual Activity  . Alcohol use: Yes    Comment: ocasionally   . Drug use: Never  . Sexual activity: Yes    Birth control/protection: Post-menopausal  Lifestyle  . Physical activity    Days per week: Not on file    Minutes per session: Not on file  . Stress: Not on file  Relationships  . Social Herbalist on phone: Not on file    Gets together: Not on file    Attends religious service: Not on  file    Active member of club or organization: Not on file    Attends meetings of clubs or organizations: Not on file    Relationship status: Not on file  . Intimate partner violence    Fear of current or ex partner: Not on file    Emotionally abused: Not on file    Physically abused: Not on file    Forced sexual activity: Not on file  Other Topics Concern  . Not on file  Social History Narrative   Married.   2 children.    Lives in [place].    Enjoys traveling.     Outpatient Medications Prior to Visit  Medication Sig Dispense Refill  . albuterol (PROVENTIL HFA) 108 (90 Base) MCG/ACT inhaler Inhale into the lungs.    Marland Kitchen estradiol (ESTRACE) 0.1 MG/GM vaginal cream Place 1 Applicatorful vaginally at bedtime. Insert 1g nightly for 1 wk, then 1 g once weekly as maintenace 42.5 g 1  . lisinopril (ZESTRIL) 10 MG tablet  Take 1 tablet (10 mg total) by mouth daily. 90 tablet 0   No facility-administered medications prior to visit.     ROS:  Review of Systems  Constitutional: Negative for fatigue, fever and unexpected weight change.  Respiratory: Negative for cough, shortness of breath and wheezing.   Cardiovascular: Negative for chest pain, palpitations and leg swelling.  Gastrointestinal: Negative for blood in stool, constipation, diarrhea, nausea and vomiting.  Endocrine: Negative for cold intolerance, heat intolerance and polyuria.  Genitourinary: Negative for dyspareunia, dysuria, flank pain, frequency, genital sores, hematuria, menstrual problem, pelvic pain, urgency, vaginal bleeding, vaginal discharge and vaginal pain.  Musculoskeletal: Negative for back pain, joint swelling and myalgias.  Skin: Negative for rash.  Neurological: Negative for dizziness, syncope, light-headedness, numbness and headaches.  Hematological: Negative for adenopathy.  Psychiatric/Behavioral: Negative for agitation, confusion, sleep disturbance and suicidal ideas. The patient is not nervous/anxious.   BREAST: No symptoms    Objective: BP 110/80   Ht 5' 3.5" (1.613 m)   Wt 194 lb 9.6 oz (88.3 kg)   BMI 33.93 kg/m    Physical Exam Constitutional:      Appearance: She is well-developed.  Genitourinary:     Vagina and uterus normal.     No vaginal discharge, erythema or tenderness.     No cervical motion tenderness or polyp.     Uterus is not enlarged or tender.     No right or left adnexal mass present.     Right adnexa not tender.     Left adnexa not tender.  Neck:     Musculoskeletal: Normal range of motion.     Thyroid: No thyromegaly.  Cardiovascular:     Rate and Rhythm: Normal rate and regular rhythm.     Heart sounds: Normal heart sounds. No murmur.  Pulmonary:     Effort: Pulmonary effort is normal.     Breath sounds: Normal breath sounds.  Chest:     Breasts:        Right: No mass, nipple  discharge, skin change or tenderness.        Left: No mass, nipple discharge, skin change or tenderness.  Abdominal:     Palpations: Abdomen is soft.     Tenderness: There is no abdominal tenderness. There is no guarding.  Musculoskeletal: Normal range of motion.  Neurological:     Mental Status: She is alert and oriented to person, place, and time.     Cranial Nerves: No cranial nerve  deficit.  Psychiatric:        Behavior: Behavior normal.  Vitals signs reviewed.     Assessment/Plan:  Encounter for annual routine gynecological examination -   Screening for breast cancer - Plan: MM 3D SCREEN BREAST BILATERAL, Pt to sched mammo [facility]  Essential hypertension - Plan: lisinopril (ZESTRIL) 10 MG tablet, Doing well on lisinopril. Rx RF. Check CMP with labs.   Blood tests for routine general physical examination - Plan: Comprehensive metabolic panel, Hemoglobin A1c, Lipid panel,   Screening cholesterol level - Plan: Lipid panel,   Screening for diabetes mellitus - Plan: Hemoglobin A1c,  Postmenopausal atrophic vaginitis - Rx RF estrace crm.  - Plan: estradiol (ESTRACE) 0.1 MG/GM vaginal cream, Doing well if uses it regularly. F/u prn.   Meds ordered this encounter  Medications  . lisinopril (ZESTRIL) 10 MG tablet    Sig: Take 1 tablet (10 mg total) by mouth daily.    Dispense:  90 tablet    Refill:  3    Order Specific Question:   Supervising Provider    Answer:   Gae Dry [id number]  . estradiol (ESTRACE) 0.1 MG/GM vaginal cream    Sig: Insert 1g once weekly as maintenace    Dispense:  42.5 g    Refill:  1    Order Specific Question:   Supervising Provider    Answer:   Gae Dry [[id number]]           GYN counsel breast self exam, mammography screening, menopause, adequate intake of calcium and vitamin D, diet and exercise    F/U  Return in about 1 year (around 02/13/2020).   B. , PA-C 02/13/2019 1:51 PM

## 2019-02-20 ENCOUNTER — Other Ambulatory Visit: Payer: Self-pay

## 2019-02-20 ENCOUNTER — Other Ambulatory Visit: Payer: Managed Care, Other (non HMO)

## 2019-02-20 DIAGNOSIS — Z0000 Encounter for general adult medical examination without abnormal findings: Secondary | ICD-10-CM

## 2019-02-20 DIAGNOSIS — Z131 Encounter for screening for diabetes mellitus: Secondary | ICD-10-CM

## 2019-02-20 DIAGNOSIS — Z13220 Encounter for screening for lipoid disorders: Secondary | ICD-10-CM

## 2019-02-21 LAB — HEMOGLOBIN A1C
Est. average glucose Bld gHb Est-mCnc: 123 mg/dL
Hgb A1c MFr Bld: 5.9 % — ABNORMAL HIGH (ref 4.8–5.6)

## 2019-02-21 LAB — COMPREHENSIVE METABOLIC PANEL
ALT: 20 IU/L (ref 0–32)
AST: 18 IU/L (ref 0–40)
Albumin/Globulin Ratio: 2.4 — ABNORMAL HIGH (ref 1.2–2.2)
Albumin: 4.5 g/dL (ref 3.8–4.9)
Alkaline Phosphatase: 74 IU/L (ref 39–117)
BUN/Creatinine Ratio: 13 (ref 9–23)
BUN: 11 mg/dL (ref 6–24)
Bilirubin Total: 0.4 mg/dL (ref 0.0–1.2)
CO2: 25 mmol/L (ref 20–29)
Calcium: 9.2 mg/dL (ref 8.7–10.2)
Chloride: 102 mmol/L (ref 96–106)
Creatinine, Ser: 0.84 mg/dL (ref 0.57–1.00)
GFR calc Af Amer: 89 mL/min/{1.73_m2} (ref >59)
GFR calc non Af Amer: 77 mL/min/{1.73_m2} (ref >59)
Globulin, Total: 1.9 g/dL (ref 1.5–4.5)
Glucose: 112 mg/dL — ABNORMAL HIGH (ref 65–99)
Potassium: 4.4 mmol/L (ref 3.5–5.2)
Sodium: 141 mmol/L (ref 134–144)
Total Protein: 6.4 g/dL (ref 6.0–8.5)

## 2019-02-21 LAB — LIPID PANEL
Chol/HDL Ratio: 3.2 ratio (ref 0.0–4.4)
Cholesterol, Total: 180 mg/dL (ref 100–199)
HDL: 57 mg/dL (ref >39)
LDL Calculated: 101 mg/dL — ABNORMAL HIGH (ref 0–99)
Triglycerides: 110 mg/dL (ref 0–149)
VLDL Cholesterol Cal: 22 mg/dL (ref 5–40)

## 2019-02-26 ENCOUNTER — Encounter: Payer: Self-pay | Admitting: Obstetrics and Gynecology

## 2019-02-28 ENCOUNTER — Encounter: Payer: Self-pay | Admitting: Obstetrics and Gynecology

## 2019-10-15 NOTE — Progress Notes (Signed)
Patient ID: Lauren Vasquez, female    DOB: [birth year], 59 y.o.   MRN: [id number]  PCP: Jamelle Haring, MD  Chief Complaint  Patient presents with  . New Patient (Initial Visit)  . Establish Care  . Back Pain  . Neck Pain  . Hyperlipidemia    Subjective:   Lauren Vasquez is a 59 y.o. female, presents to clinic with CC of the following:  Chief Complaint  Patient presents with  . New Patient (Initial Visit)  . Establish Care  . Back Pain  . Neck Pain  . Hyperlipidemia    HPI:  Patient is a 59 year old female who presents today new to the practice. Her last visit with her prior family physician was in October 2019  All in all, she notes she has been feeling well.  Notes she has some aches and pains at times, more recently in the neck area, with days where it is uncomfortable with lateral movements of the neck, and other days are okay.  She asked about chiropractors.  She denies any radiating symptoms down the arms with no numbness or tingling down the arms, no pains down the arms.  No weakness.  Symptoms not markedly limiting presently.  She was seen in October 2020 by her OB/GYN for an annual physical: Noted patient is postmenopausal .  Also noted she does have vaginal dryness and uses lubricants and estrace crm with sx improvement. Last Pap: 10/30/17  - no abnormalities /neg HPV DNA.  Last mammogram: 02/2019 - Results were: normal--routine follow-up in 12 months There is no FH of breast cancer. + FH of ovarian cancer in her PGM.  Will continue to be followed by OB/gyn for woman's health issues.  Colonoscopy: 2-3 years ago (2018) without abnormalities.  Repeat due after 5 years due to hx of colon polyps in past. + FH colon CA - father, dx'ed 28  Tobacco use: The patient denies current or previous tobacco use. Alcohol use: social drinker  Hyperlipidemia Had borderline lipids 11/17, 5/19. Not on medications   Lab Results  Component Value Date   CHOL 180  02/20/2019   HDL 57 02/20/2019   LDLCALC 101 (H) 02/20/2019   TRIG 110 02/20/2019   CHOLHDL 3.2 02/20/2019   Pre-DM   Lab Results  Component Value Date   HGBA1C 5.9 (H) 02/20/2019  Blood glucose 112 on comp panel in 01/2019  HTN - Had elevated BPs last yr.  BP Readings from Last 3 Encounters:  10/16/19 130/90  02/13/19 110/80  04/09/18 122/80   Medication regimen - lisinopril 10 mg.  Takes regularly Does check BP's at home - normally 120's/80's, 127/83 today No chest pains, palpitations, shortness of breath, lower extremity swelling, increased headaches or vision changes.  Obesity  BMI's have been over 30 in the recent past. Weight has remained relatively stable in the past couple years.Tried to lose weight prior, not very successful Used to do a lot of walking, lately not do much exercise at all.  Strongly encouraged trying to get back to more physical activity Wt Readings from Last 3 Encounters:  10/16/19 199 lb 11.2 oz (90.6 kg)  02/13/19 194 lb 9.6 oz (88.3 kg)  04/09/18 198 lb (89.8 kg)   Anxiety - stresses increased, taking care of 58 year old father who now lives with her, daughter/husband and child recently moved in as well after they sold their house.  She notes some days are worse than others with respect to anxiety.  GAD-7 was reviewed today.  She denied any major depressive symptoms, stated feels more anxious than depressed.  Again has some days that are better than others.  h/o intermittent asthma/allergic rhinitis -  managed on albuterol HFA which has used infrequently, was on Singulair at one point in her past. Asthma usually only an issue with allergies with season change or with a chest cold. Was in [place] for 5 years and allergies ok there.  Has been back now this past year. Has some flonase and zyrtec to use as needed for allergy symptoms.  Tob - former smoker,  Alcohol - occas wine Not have Covid Vaccine, but does think she will get.  Encouraged.  Patient  Active Problem List   Diagnosis Date Noted  . Class 1 obesity due to excess calories with serious comorbidity and body mass index (BMI) of 34.0 to 34.9 in adult 10/16/2019  . Mixed hyperlipidemia 10/16/2019  . Polyp of colon 10/16/2019  . Seasonal allergic rhinitis due to pollen 10/16/2019  . Prediabetes 04/09/2018  . Asthma 04/09/2018  . Vaginal atrophy 04/09/2018  . Acute foot pain 04/09/2018  . Essential hypertension 10/30/2017      Current Outpatient Medications:  .  albuterol (PROVENTIL HFA) 108 (90 Base) MCG/ACT inhaler, Inhale into the lungs every 4 (four) hours as needed. , Disp: , Rfl:  .  estradiol (ESTRACE) 0.1 MG/GM vaginal cream, Insert 1g once weekly as maintenace, Disp: 42.5 g, Rfl: 1 .  lisinopril (ZESTRIL) 10 MG tablet, Take 1 tablet (10 mg total) by mouth daily., Disp: 90 tablet, Rfl: 3   Allergies  Allergen Reactions  . Shrimp [Shellfish Allergy]   . Sulfites      Past Surgical History:  Procedure Laterality Date  . CESAREAN SECTION  1991/1992  . LYMPHADENECTOMY  1985  . TMJ ARTHROPLASTY  1989     Family History  Problem Relation Age of Onset  . Prostate cancer Father   . Skin cancer Father   . Colon cancer Father   . Hypertension Father   . Skin cancer Brother   . Lung cancer Maternal Grandfather   . Ovarian cancer Paternal Grandmother   . Hypertension Mother   . Hypertension Sister   . Breast cancer Neg Hx      Social History   Tobacco Use  . Smoking status: Former Games developer  . Smokeless tobacco: Never Used  . Tobacco comment: quit ~ 2000  Substance Use Topics  . Alcohol use: Yes    Comment: ocasionally     With staff assistance, above reviewed with the patient today.  ROS: As per HPI, otherwise no specific complaints on a limited and focused system review   No results found for this or any previous visit (from the past 72 hour(s)).   PHQ2/9: Depression screen PHQ 2/9 10/16/2019  Decreased Interest 1  Down, Depressed, Hopeless 0   PHQ - 2 Score 1  Altered sleeping 1  Tired, decreased energy 3  Change in appetite 0  Feeling bad or failure about yourself  1  Trouble concentrating 1  Moving slowly or fidgety/restless 0  Suicidal thoughts 0  PHQ-9 Score 7  Difficult doing work/chores Somewhat difficult   PHQ-2/9 Result reviewed GAD 7 : Generalized Anxiety Score 10/16/2019  Nervous, Anxious, on Edge 2  Control/stop worrying 1  Worry too much - different things 0  Trouble relaxing 0  Restless 0  Easily annoyed or irritable 1  Afraid - awful might happen 1  Total GAD 7  Score 5  Anxiety Difficulty Somewhat difficult  Result reviewed   Fall Risk: Fall Risk  10/16/2019  Falls in the past year? 1  Number falls in past yr: 0  Injury with Fall? 0      Objective:   Vitals:   10/16/19 1313 10/16/19 1329  BP: (!) 138/92 130/90  Pulse: 66   Resp: 16   Temp: (!) 97.3 F (36.3 C)   TempSrc: Temporal   SpO2: 96%   Weight: 199 lb 11.2 oz (90.6 kg)   Height: 5' 3.5" (1.613 m)     Body mass index is 34.82 kg/m. Recheck of BP by Melissa was 130/90, rechecked by myself was 135/90 Physical Exam   NAD, masked, pleasant HEENT - [place]/AT, sclera anicteric, PERRL, EOMI, conj - non-inj'ed, TM's and canals clear, pharynx clear Neck - supple, adequate range of motion today, no adenopathy, no TM, carotids 2+ and = without bruits bilat Car - RRR without m/g/r Pulm- RR and effort normal at rest, CTA without wheeze or rales Abd - soft, obese, NT, ND, BS+,  no masses, no HSM Back - no CVA tenderness Skin- no rash noted on exposed areas,  Ext - no LE edema, no active joints Neuro/psychiatric - affect was not flat, appropriate with conversation  Alert and oriented  Grossly non-focal - good strength on testing extremities including good grip strength bilateral, sensation intact to LT in distal extremities, DTRs 2+ and equal in the patella, Romberg was negative, no pronator drift, good finger-to-nose, good balance on 1 foot,  good tandem walk  Speech and gait are normal   Results for orders placed or performed in visit on 02/20/19  Lipid panel  Result Value Ref Range   Cholesterol, Total 180 100 - 199 mg/dL   Triglycerides 409 0 - 149 mg/dL   HDL 57 >81 mg/dL   VLDL Cholesterol Cal 22 5 - 40 mg/dL   LDL Calculated 191 (H) 0 - 99 mg/dL   Chol/HDL Ratio 3.2 0.0 - 4.4 ratio  Hemoglobin A1c  Result Value Ref Range   Hgb A1c MFr Bld 5.9 (H) 4.8 - 5.6 %   Est. average glucose Bld gHb Est-mCnc 123 mg/dL  Comprehensive metabolic panel  Result Value Ref Range   Glucose 112 (H) 65 - 99 mg/dL   BUN 11 6 - 24 mg/dL   Creatinine, Ser 4.78 0.57 - 1.00 mg/dL   GFR calc non Af Amer 77 >59 mL/min/1.73   GFR calc Af Amer 89 >59 mL/min/1.73   BUN/Creatinine Ratio 13 9 - 23   Sodium 141 134 - 144 mmol/L   Potassium 4.4 3.5 - 5.2 mmol/L   Chloride 102 96 - 106 mmol/L   CO2 25 20 - 29 mmol/L   Calcium 9.2 8.7 - 10.2 mg/dL   Total Protein 6.4 6.0 - 8.5 g/dL   Albumin 4.5 3.8 - 4.9 g/dL   Globulin, Total 1.9 1.5 - 4.5 g/dL   Albumin/Globulin Ratio 2.4 (H) 1.2 - 2.2   Bilirubin Total 0.4 0.0 - 1.2 mg/dL   Alkaline Phosphatase 74 39 - 117 IU/L   AST 18 0 - 40 IU/L   ALT 20 0 - 32 IU/L       Assessment & Plan:   1. Essential hypertension Her blood pressure was borderline high in the office today, although the check at home today was reasonable.  Do feel there was some stress and anxiety related to this new patient visit today as well that may be  a factor.  She remains on the lisinopril daily. Felt reasonable for her to continue to check her blood pressure readings at home, and if the top number is greater than 140 or the bottom number is greater than 90 with any regularity, she should call to follow-up sooner than the planned follow-up below. Discussed getting potential laboratory tests at some point again, and noting the results above with her last comp panel being essentially normal with the exception of a slightly  high glucose, did not feel critical to get them today, and she has not been fasting which is preferred to check the lipid panel and a fasting blood sugar.  It was felt reasonable to check labs on a follow-up visit, and for her to schedule in the morning and be fasting on that visit and she was in agreement with this.  2. Mixed hyperlipidemia Prior lipid panel had just a slightly higher than desired LDL level, and will recheck again on follow-up when she is fasting.  3. Polyp of colon, unspecified part of colon, unspecified type She is due for another colonoscopy in 2023 after her last one in 2018 was normal per her reports.  She had polyps on the one prior.  Also, she has a positive family history, father, although his diagnosis was was in his 72s.  4. Prediabetes/Sedentary Prior lab studies in August were consistent with prediabetes, and discussed the entity briefly with her today.  Dietary recommendations were noted with information provided in the AVS as well. We will check labs again on her follow-up visit when she is fasting Also strongly encouraged getting more active again and the importance of that related to not only prediabetes, but other medical conditions discussed today, including the anxiety issues as I do feel exercise is helpful in that regard as well.  5. Mild intermittent asthma without complication Well-controlled with intermittent use of the albuterol inhaler, mostly needed with change of seasons allergy symptoms that occur.  6. Seasonal allergic rhinitis due to pollen Has Flonase and a nonsedating antihistamine to use as needed for symptoms. Also has an albuterol inhaler to use as needed with some asthma symptoms that can occur during this time a year.  She notes the pollen seem to be major precipitants. Follow-up if more problematic.  7. Vaginal atrophy As the cream to apply from OB/GYN recommendations, and she plans to continue to follow with them.  8. Class 1 obesity  due to excess calories with serious comorbidity and body mass index (BMI) of 34.0 to 34.9 in adult Discussed the importance of weight maintenance related to her medical conditions above, and she has struggled to lose weight in the past.  Dietary modifications discussed, and did note a [place] Mediterranean type diet that often can be helpful as well. Also increasing aerobic activity is important to help with goals of trying to lose weight.  Encouraged that today.  9. FH: colon cancer Father with colon cancer, although diagnosed in his 29s noted.  10. Strain of neck muscle, initial encounter Encourage no radicular signs or symptoms, and do feel more of a muscle strain/spasm entity Discussed management presently, and noted have to be careful with chiropractic manipulations, especially as they relate to the neck and spine, and recommended local measures with warm compresses and range of motion activities, followed by ice if some discomfort persists after, and regular exercises to help with the neck and back over time.  Also more aerobic activity can be helpful, and do feel the anxiety/increased  stress is contributing to this as well. Briefly discussed a muscle relaxer that can be used at bedtime to help, and she was hesitant to add today, although it is more problematic over time may be something to add in the near future.  Also discussed potentially having physical therapy involved over time pending her status.  11. Depression with anxiety Patient with intermittent anxiety symptoms, lesser depression concerns. Discussed if more problematic over time, limiting at all, the potential need for discussing medications, counseling involvement. Also, as above noted, increasing her physical activity with some regular exercise is strongly encouraged to help. We will continue to monitor presently.   Tentatively, patient will follow up in the July timeframe, with labs to be obtained at that time, and can  follow-up sooner as needed.   Jamelle HaringLIFFORD D Esther Bradstreet, MD 10/16/19 1:30 PM

## 2019-10-16 ENCOUNTER — Encounter: Payer: Self-pay | Admitting: Internal Medicine

## 2019-10-16 ENCOUNTER — Other Ambulatory Visit: Payer: Self-pay

## 2019-10-16 ENCOUNTER — Ambulatory Visit (INDEPENDENT_AMBULATORY_CARE_PROVIDER_SITE_OTHER): Payer: Managed Care, Other (non HMO) | Admitting: Internal Medicine

## 2019-10-16 VITALS — BP 130/90 | HR 66 | Temp 97.3°F | Resp 16 | Ht 63.5 in | Wt 199.7 lb

## 2019-10-16 DIAGNOSIS — K635 Polyp of colon: Secondary | ICD-10-CM | POA: Insufficient documentation

## 2019-10-16 DIAGNOSIS — Z800 Family history of malignant neoplasm of digestive organs: Secondary | ICD-10-CM | POA: Insufficient documentation

## 2019-10-16 DIAGNOSIS — S161XXA Strain of muscle, fascia and tendon at neck level, initial encounter: Secondary | ICD-10-CM

## 2019-10-16 DIAGNOSIS — Z6834 Body mass index (BMI) 34.0-34.9, adult: Secondary | ICD-10-CM

## 2019-10-16 DIAGNOSIS — E6609 Other obesity due to excess calories: Secondary | ICD-10-CM | POA: Insufficient documentation

## 2019-10-16 DIAGNOSIS — F418 Other specified anxiety disorders: Secondary | ICD-10-CM | POA: Insufficient documentation

## 2019-10-16 DIAGNOSIS — R7303 Prediabetes: Secondary | ICD-10-CM | POA: Diagnosis not present

## 2019-10-16 DIAGNOSIS — J301 Allergic rhinitis due to pollen: Secondary | ICD-10-CM

## 2019-10-16 DIAGNOSIS — N952 Postmenopausal atrophic vaginitis: Secondary | ICD-10-CM

## 2019-10-16 DIAGNOSIS — E782 Mixed hyperlipidemia: Secondary | ICD-10-CM | POA: Diagnosis not present

## 2019-10-16 DIAGNOSIS — I10 Essential (primary) hypertension: Secondary | ICD-10-CM

## 2019-10-16 DIAGNOSIS — J4520 Mild intermittent asthma, uncomplicated: Secondary | ICD-10-CM

## 2019-10-16 NOTE — Patient Instructions (Signed)

## 2020-01-15 ENCOUNTER — Ambulatory Visit: Payer: Managed Care, Other (non HMO) | Admitting: Internal Medicine

## 2020-01-20 NOTE — Progress Notes (Signed)
Patient ID: Lauren Vasquez, female    DOB: 01-31-1961, 59 y.o.   MRN: [id number]  PCP: Jamelle Haring, MD  Chief Complaint  Patient presents with  . Follow-up    Subjective:   Lauren Vasquez is a 59 y.o. female, presents to clinic with CC of the following:  Chief Complaint  Patient presents with  . Follow-up    HPI:  Patient is a 59 year old female who established with this practice 10/16/19 She follows up today.  All in all, she noted from a health perspective, she has been doing good, although admits stresses have been high in the more recent past, and has really been struggling today.  She continues to care for her father, without much assistance from her siblings, and is quite upsetting to her at times including this morning.  She was occasionally tearful in our conversations with this this morning (see anxiety below)  Hyperlipidemia + history of borderline hyperlipidemia Not on medications  Lab Results  Component Value Date   CHOL 180 02/20/2019   HDL 57 02/20/2019   LDLCALC 101 (H) 02/20/2019   TRIG 110 02/20/2019   CHOLHDL 3.2 02/20/2019    Pre-DM       Lab Results  Component Value Date   HGBA1C 5.9 (H) 02/20/2019  Blood glucose 112 on comp panel in 01/2019  HTN  BP Readings from Last 3 Encounters:  01/21/20 (!) 138/92  10/16/19 130/90  02/13/19 110/80   Today has been a stressful day per pateint Medication regimen - lisinopril 10 mg.   Takes regularly Does check BP's at home - normally 130-140/80's, No chest pains, palpitations, shortness of breath, lower extremity swelling, increased headaches or vision changes.  Obesity  BMI's have been over 30 in the recent past. Weight has remained relatively stable in the past couple years.  Not had much success trying to lose weight. Exercise - Used to do a lot of walking, lately not do much exercise at all.  Strongly encouraged more exercise but difficult with caring for Dad Diet - Tries to  eat a healthy diet  Wt Readings from Last 3 Encounters:  01/21/20 194 lb 9.6 oz (88.3 kg)  10/16/19 199 lb 11.2 oz (90.6 kg)  02/13/19 194 lb 9.6 oz (88.3 kg)    Anxiety - stresses increased, taking care of 16 year old father who now lives with her, and daughter/husband and child recently moved in as well after they sold their house.  They have closed on a new house, and will likely be moving out soon. She notes some days are worse than others with respect to anxiety.  Sister and brother not want to help with issues.  She denied any major depressive symptoms, stated feels more anxious than depressed.  Again has some days that are better than others, and does have occasional panic episodes, where she can feel her heart beating faster at times, although does not persist, and denies any frank chest pains when this occurs. States not have time for counseling. Has support form those at home presently. She thought the medication may be helpful when we discussed this today.  She has not been on medication in the past for anxiety or depression.  h/o intermittent asthma/allergic rhinitis -  managed on albuterol HFA which has used infrequently, was on Singulair at one point in her past. Asthma usually only an issue with allergies with season change or with a chest cold. Was in [place] for 5 years  and allergies ok there.  Has been back now this past year. Has some flonase and zyrtec to use as needed for allergy symptoms. No recent increased sx's  To review:   She was seen in October 2020 by her OB/GYN for an annual physical: Noted patient is postmenopausal .  Also noted she doeshave vaginal dryness and uses lubricants andestrace crm with sx improvement. Last Pap:10/30/17 - no abnormalities/neg HPV DNA.  Last mammogram:02/2019 -Results were: normal--routine follow-up in 12 months There is no FH of breast cancer. + FH of ovarian cancer in her PGM.  Will continue to be followed by OB/gyn for woman's  health issues.  Last Colonoscopy: 2-3years ago (2018) withoutabnormalities. Repeat due after 5 years due to hx of colon polyps in past. + FH colon CA - father, dx'ed 87  Tobacco use: The patient denies current or previous tobacco use. Alcohol use: social drinker, not increased recent past Had Covid Vaccine    Patient Active Problem List   Diagnosis Date Noted  . Class 1 obesity due to excess calories with serious comorbidity and body mass index (BMI) of 34.0 to 34.9 in adult 10/16/2019  . Mixed hyperlipidemia 10/16/2019  . Polyp of colon 10/16/2019  . Seasonal allergic rhinitis due to pollen 10/16/2019  . FH: colon cancer 10/16/2019  . Depression with anxiety 10/16/2019  . Prediabetes 04/09/2018  . Asthma 04/09/2018  . Vaginal atrophy 04/09/2018  . Acute foot pain 04/09/2018  . Essential hypertension 10/30/2017      Current Outpatient Medications:  .  albuterol (PROVENTIL HFA) 108 (90 Base) MCG/ACT inhaler, Inhale into the lungs every 4 (four) hours as needed. , Disp: , Rfl:  .  estradiol (ESTRACE) 0.1 MG/GM vaginal cream, Insert 1g once weekly as maintenace, Disp: 42.5 g, Rfl: 1 .  lisinopril (ZESTRIL) 10 MG tablet, Take 1 tablet (10 mg total) by mouth daily., Disp: 90 tablet, Rfl: 3   Allergies  Allergen Reactions  . Shrimp [Shellfish Allergy]   . Sulfites      Past Surgical History:  Procedure Laterality Date  . CESAREAN SECTION  1991/1992  . LYMPHADENECTOMY  1985  . TMJ ARTHROPLASTY  1989     Family History  Problem Relation Age of Onset  . Prostate cancer Father   . Skin cancer Father   . Colon cancer Father   . Hypertension Father   . Skin cancer Brother   . Lung cancer Maternal Grandfather   . Ovarian cancer Paternal Grandmother   . Hypertension Mother   . Hypertension Sister   . Breast cancer Neg Hx      Social History   Tobacco Use  . Smoking status: Former Games developer  . Smokeless tobacco: Never Used  . Tobacco comment: quit ~ 2000    Substance Use Topics  . Alcohol use: Yes    Comment: ocasionally     With staff assistance, above reviewed with the patient/caregiver today.  ROS: As per HPI, he denies she is feeling a little more fatigued in the recent past, not marked, and thinks related to stresses.  Otherwise no specific complaints on a limited and focused system review   No results found for this or any previous visit (from the past 72 hour(s)).   PHQ2/9: Depression screen [facility] 2/9 01/21/2020 10/16/2019  Decreased Interest - 1  Down, Depressed, Hopeless 1 0  PHQ - 2 Score 1 1  Altered sleeping 1 1  Tired, decreased energy 0 3  Change in appetite 0 0  Feeling  bad or failure about yourself  1 1  Trouble concentrating 1 1  Moving slowly or fidgety/restless 0 0  Suicidal thoughts 0 0  PHQ-9 Score 4 7  Difficult doing work/chores Somewhat difficult Somewhat difficult   PHQ-2/9 Result reviewed    Fall Risk: Fall Risk  01/21/2020 10/16/2019  Falls in the past year? 0 1  Number falls in past yr: 0 0  Injury with Fall? 0 0      Objective:   Vitals:   01/21/20 1056  BP: (!) 138/92  Pulse: 81  Resp: 16  Temp: 98.3 F (36.8 C)  TempSrc: Temporal  SpO2: 96%  Weight: 194 lb 9.6 oz (88.3 kg)  Height: 5\' 4"  (1.626 m)    Body mass index is 33.4 kg/m.  Physical Exam   NAD, masked, pleasant, tearful at times when discussing increased stresses recently HEENT - [place]/AT, sclera anicteric, PERRL, EOMI, conj - non-inj'ed, pharynx clear Neck - supple, no adenopathy, no TM, carotids 2+ and = without bruits bilat Car - RRR without m/g/r Pulm- RR and effort normal at rest, CTA without wheeze or rales Abd - soft, obese, NT diffusely, mildly obese Back - no CVA tenderness Ext - no LE edema,  Neuro/psychiatric - affect was not flat, appropriate with conversation             Alert and oriented             Grossly non-focal - good strength on testing extremities including good grip strength bilateral, sensation  intact to LT in distal extremities,              Speech normal   Results for orders placed or performed in visit on 02/20/19  Lipid panel  Result Value Ref Range   Cholesterol, Total 180 100 - 199 mg/dL   Triglycerides 161110 0 - 149 mg/dL   HDL 57 >09>39 mg/dL   VLDL Cholesterol Cal 22 5 - 40 mg/dL   LDL Calculated 604101 (H) 0 - 99 mg/dL   Chol/HDL Ratio 3.2 0.0 - 4.4 ratio  Hemoglobin A1c  Result Value Ref Range   Hgb A1c MFr Bld 5.9 (H) 4.8 - 5.6 %   Est. average glucose Bld gHb Est-mCnc 123 mg/dL  Comprehensive metabolic panel  Result Value Ref Range   Glucose 112 (H) 65 - 99 mg/dL   BUN 11 6 - 24 mg/dL   Creatinine, Ser 5.400.84 0.57 - 1.00 mg/dL   GFR calc non Af Amer 77 >59 mL/min/1.73   GFR calc Af Amer 89 >59 mL/min/1.73   BUN/Creatinine Ratio 13 9 - 23   Sodium 141 134 - 144 mmol/L   Potassium 4.4 3.5 - 5.2 mmol/L   Chloride 102 96 - 106 mmol/L   CO2 25 20 - 29 mmol/L   Calcium 9.2 8.7 - 10.2 mg/dL   Total Protein 6.4 6.0 - 8.5 g/dL   Albumin 4.5 3.8 - 4.9 g/dL   Globulin, Total 1.9 1.5 - 4.5 g/dL   Albumin/Globulin Ratio 2.4 (H) 1.2 - 2.2   Bilirubin Total 0.4 0.0 - 1.2 mg/dL   Alkaline Phosphatase 74 39 - 117 IU/L   AST 18 0 - 40 IU/L   ALT 20 0 - 32 IU/L   Last labs reviewed    Assessment & Plan:    1. Essential hypertension Concerned with the last couple readings in the office that her blood pressure may be a little borderline high, although checks at home have been good in  the recent past. Do feel stress is definitely contributing to her blood pressure readings. Felt best to check labs today She will continue to check her blood pressures at home, and asked that she write them down and bring them with her on her next follow-up visit To feel if her anxiety symptoms can lessen, and will add a medicine to help with that today, may help with her blood pressure as well, and we will hold off on increasing her lisinopril today. If her readings at home start to become more  elevated, will likely need to do so. - COMPLETE METABOLIC PANEL WITH GFR  2. Mixed hyperlipidemia We will recheck the lipid panel today. Not on medications presently - Lipid panel - COMPLETE METABOLIC PANEL WITH GFR  3. Prediabetes Recheck the A1c as part of the labs checked today. - COMPLETE METABOLIC PANEL WITH GFR - Hemoglobin A1c  4. Mild intermittent asthma without complication Has been stable.  Asked the albuterol to use as needed.  5. Seasonal allergic rhinitis due to pollen No recent concerns noted.  6. Depression with anxiety 7. Generalized anxiety disorder Agree with patient that she seems to suffer more from anxiety, although I am concerned she has some mild depressive symptoms related.  It has been a very stressful time in the recent past noted. Discussed potential counseling involvement, and she states she does not have time presently to do so, although discussed the importance that it may be needed to make time pending how she is doing.  She states she does get good support from the people presently in her household. Discussed medications that can help, and she really thought it might be helpful to try after our discussion, and will add Lexapro-10 mg, take half a tablet for the first week, and if doing okay, increase to 1 tablet daily.  Did warn may upset the stomach some early, and often goes away after on it for a week or 2. Also will check labs today including a TSH. - TSH - COMPLETE METABOLIC PANEL WITH GFR - CBC with Differential/Platelet - escitalopram (LEXAPRO) 10 MG tablet; Take 1 tablet (10 mg total) by mouth daily. Take 1/2 tablet for first week.  Dispense: 90 tablet; Refill: 1  8. Class 1 obesity due to excess calories with serious comorbidity and body mass index (BMI) of 33.0 to 33.9 in adult Weight has been stable, and the importance of healthy weight maintenance again noted today.  9. Fatigue, unspecified type She has been more fatigued in the recent  past, likely a lot related to her anxiety/stresses. We will check a CBC and TSH as part of the lab panel.  - TSH - CBC with Differential/Platelet  Await labs ordered today, and will schedule follow-up in 4 to 6 weeks time, will important to help ensure her blood pressure is remaining controlled, and also to see how she is doing with the Lexapro product. Follow-up sooner as needed.      Jamelle Haring, MD 01/21/20 11:05 AM

## 2020-01-21 ENCOUNTER — Encounter: Payer: Self-pay | Admitting: Internal Medicine

## 2020-01-21 ENCOUNTER — Other Ambulatory Visit: Payer: Self-pay

## 2020-01-21 ENCOUNTER — Ambulatory Visit (INDEPENDENT_AMBULATORY_CARE_PROVIDER_SITE_OTHER): Payer: Managed Care, Other (non HMO) | Admitting: Internal Medicine

## 2020-01-21 VITALS — BP 138/92 | HR 81 | Temp 98.3°F | Resp 16 | Ht 64.0 in | Wt 194.6 lb

## 2020-01-21 DIAGNOSIS — R5383 Other fatigue: Secondary | ICD-10-CM

## 2020-01-21 DIAGNOSIS — Z6833 Body mass index (BMI) 33.0-33.9, adult: Secondary | ICD-10-CM

## 2020-01-21 DIAGNOSIS — J4520 Mild intermittent asthma, uncomplicated: Secondary | ICD-10-CM

## 2020-01-21 DIAGNOSIS — F411 Generalized anxiety disorder: Secondary | ICD-10-CM

## 2020-01-21 DIAGNOSIS — I10 Essential (primary) hypertension: Secondary | ICD-10-CM

## 2020-01-21 DIAGNOSIS — E782 Mixed hyperlipidemia: Secondary | ICD-10-CM | POA: Diagnosis not present

## 2020-01-21 DIAGNOSIS — E6609 Other obesity due to excess calories: Secondary | ICD-10-CM

## 2020-01-21 DIAGNOSIS — F418 Other specified anxiety disorders: Secondary | ICD-10-CM

## 2020-01-21 DIAGNOSIS — R7303 Prediabetes: Secondary | ICD-10-CM | POA: Diagnosis not present

## 2020-01-21 DIAGNOSIS — J301 Allergic rhinitis due to pollen: Secondary | ICD-10-CM

## 2020-01-21 MED ORDER — ESCITALOPRAM OXALATE 10 MG PO TABS: 10.0000 mg | ORAL_TABLET | Freq: Every day | ORAL | 1 refills | Status: DC

## 2020-01-22 LAB — COMPLETE METABOLIC PANEL WITH GFR
AG Ratio: 2 (calc) (ref 1.0–2.5)
ALT: 23 U/L (ref 6–29)
AST: 19 U/L (ref 10–35)
Albumin: 4.8 g/dL (ref 3.6–5.1)
Alkaline phosphatase (APISO): 71 U/L (ref 37–153)
BUN: 12 mg/dL (ref 7–25)
CO2: 27 mmol/L (ref 20–32)
Calcium: 9.7 mg/dL (ref 8.6–10.4)
Chloride: 102 mmol/L (ref 98–110)
Creat: 0.72 mg/dL (ref 0.50–1.05)
GFR, Est African American: 107 mL/min/{1.73_m2} (ref >=60)
GFR, Est Non African American: 92 mL/min/{1.73_m2} (ref >=60)
Globulin: 2.4 g/dL (calc) (ref 1.9–3.7)
Glucose, Bld: 104 mg/dL — ABNORMAL HIGH (ref 65–99)
Potassium: 4.6 mmol/L (ref 3.5–5.3)
Sodium: 141 mmol/L (ref 135–146)
Total Bilirubin: 0.5 mg/dL (ref 0.2–1.2)
Total Protein: 7.2 g/dL (ref 6.1–8.1)

## 2020-01-22 LAB — LIPID PANEL
Cholesterol: 211 mg/dL — ABNORMAL HIGH (ref <200)
HDL: 49 mg/dL — ABNORMAL LOW (ref >=50)
LDL Cholesterol (Calc): 138 mg/dL (calc) — ABNORMAL HIGH
Non-HDL Cholesterol (Calc): 162 mg/dL (calc) — ABNORMAL HIGH (ref <130)
Total CHOL/HDL Ratio: 4.3 (calc) (ref <5.0)
Triglycerides: 121 mg/dL (ref <150)

## 2020-01-22 LAB — CBC WITH DIFFERENTIAL/PLATELET
Absolute Monocytes: 554 cells/uL (ref 200–950)
Basophils Absolute: 62 cells/uL (ref 0–200)
Basophils Relative: 1.1 %
Eosinophils Absolute: 11 cells/uL — ABNORMAL LOW (ref 15–500)
Eosinophils Relative: 0.2 %
HCT: 43.7 % (ref 35.0–45.0)
Hemoglobin: 14.9 g/dL (ref 11.7–15.5)
Lymphs Abs: 2246 cells/uL (ref 850–3900)
MCH: 31.8 pg (ref 27.0–33.0)
MCHC: 34.1 g/dL (ref 32.0–36.0)
MCV: 93.4 fL (ref 80.0–100.0)
MPV: 8.8 fL (ref 7.5–12.5)
Monocytes Relative: 9.9 %
Neutro Abs: 2727 cells/uL (ref 1500–7800)
Neutrophils Relative %: 48.7 %
Platelets: 261 10*3/uL (ref 140–400)
RBC: 4.68 10*6/uL (ref 3.80–5.10)
RDW: 12.1 % (ref 11.0–15.0)
Total Lymphocyte: 40.1 %
WBC: 5.6 10*3/uL (ref 3.8–10.8)

## 2020-01-22 LAB — HEMOGLOBIN A1C
Hgb A1c MFr Bld: 5.7 % of total Hgb — ABNORMAL HIGH (ref <5.7)
Mean Plasma Glucose: 117 (calc)
eAG (mmol/L): 6.5 (calc)

## 2020-01-22 LAB — TSH: TSH: 1.89 mIU/L (ref 0.40–4.50)

## 2020-02-16 NOTE — Progress Notes (Signed)
PCP: Towanda Malkin, MD   Chief Complaint  Patient presents with  . Gynecologic Exam    HPI:      [name] is a 59 y.o. G2P2002 who LMP was No LMP recorded. Patient is postmenopausal., presents today for her annual examination.  Her menses are absent due to menopause. She does not have intermenstrual bleeding. She does have occas vasomotor sx.   Sex activity: single partner, contraception - post menopausal status. She does have vaginal dryness and uses lubricants and estrace crm 1 g wkly with some sx improvement. Hasn't tried coconut oil.  Last Pap: 10/30/17  Results were: no abnormalities /neg HPV DNA.  Hx of STDs: none  Last mammogram: 02/26/19  Results were: normal--routine follow-up in 12 months There is no FH of breast cancer. There is a FH of ovarian cancer in her PGM. Pt is MyRisk neg 2016.  The patient does do self-breast exams.  Colonoscopy: colonoscopy 2018 without abnormalities with Dr. Vira Agar.  Repeat due after 5 years due to hx of polyps in past.  Tobacco use: The patient denies current or previous tobacco use. Alcohol use: social drinker  No drug use  Exercise: moderately active  She does get adequate calcium and Vitamin D in her diet.  Labs with PCP 7/21. Hx of pre-DM and borderline lipids. Being followed by PCP. Also with dx of HTN a couple yrs ago. Started on lisinopril 10 mg daily and doing well. BP fairly well controlled. I had been managing BP but since pt has PCP, suggested he do all f/u and Rx RF now. Pt agrees to plan.   Past Medical History:  Diagnosis Date  . Allergic rhinitis   . Asthma   . BRCA negative 2016   MyRisk neg  . Essential hypertension   . Family history of ovarian cancer 2016   MyRisk neg  . Pre-diabetes 2019    Past Surgical History:  Procedure Laterality Date  . CESAREAN SECTION  1991/1992  . COLONOSCOPY  2018   [facility], repeat after 5 yrs  . LYMPHADENECTOMY  1985  . TMJ ARTHROPLASTY  1989    Family History    Problem Relation Age of Onset  . Prostate cancer Father   . Skin cancer Father   . Colon cancer Father   . Hypertension Father   . Skin cancer Brother   . Lung cancer Maternal Grandfather   . Ovarian cancer Paternal Grandmother   . Hypertension Mother   . Hypertension Sister   . Breast cancer Neg Hx     Social History   Socioeconomic History  . Marital status: Married    Spouse name: Not on file  . Number of children: Not on file  . Years of education: Not on file  . Highest education level: Not on file  Occupational History  . Not on file  Tobacco Use  . Smoking status: Former Research scientist (life sciences)  . Smokeless tobacco: Never Used  . Tobacco comment: quit ~ 2000  Vaping Use  . Vaping Use: Never used  Substance and Sexual Activity  . Alcohol use: Yes    Comment: ocasionally   . Drug use: Never  . Sexual activity: Yes    Birth control/protection: Post-menopausal  Other Topics Concern  . Not on file  Social History Narrative   Married.   2 children.    Lives in [place].    Enjoys traveling.    Social Determinants of Health   Financial Resource Strain:   .  Difficulty of Paying Living Expenses: Not on file  Food Insecurity:   . Worried About Charity fundraiser in the Last Year: Not on file  . Ran Out of Food in the Last Year: Not on file  Transportation Needs:   . Lack of Transportation (Medical): Not on file  . Lack of Transportation (Non-Medical): Not on file  Physical Activity:   . Days of Exercise per Week: Not on file  . Minutes of Exercise per Session: Not on file  Stress:   . Feeling of Stress : Not on file  Social Connections:   . Frequency of Communication with Friends and Family: Not on file  . Frequency of Social Gatherings with Friends and Family: Not on file  . Attends Religious Services: Not on file  . Active Member of Clubs or Organizations: Not on file  . Attends Archivist Meetings: Not on file  . Marital Status: Not on file  Intimate Partner  Violence:   . Fear of Current or Ex-Partner: Not on file  . Emotionally Abused: Not on file  . Physically Abused: Not on file  . Sexually Abused: Not on file    Outpatient Medications Prior to Visit  Medication Sig Dispense Refill  . albuterol (PROVENTIL HFA) 108 (90 Base) MCG/ACT inhaler Inhale into the lungs every 4 (four) hours as needed.     Marland Kitchen escitalopram (LEXAPRO) 10 MG tablet Take 1 tablet (10 mg total) by mouth daily. Take 1/2 tablet for first week. 90 tablet 1  . lisinopril (ZESTRIL) 10 MG tablet Take 1 tablet (10 mg total) by mouth daily. 90 tablet 3  . RETIN-A MICRO PUMP 0.08 % GEL Apply topically at bedtime.    Marland Kitchen estradiol (ESTRACE) 0.1 MG/GM vaginal cream Insert 1g once weekly as maintenace 42.5 g 1   No facility-administered medications prior to visit.    ROS:  Review of Systems  Constitutional: Negative for fatigue, fever and unexpected weight change.  Respiratory: Negative for cough, shortness of breath and wheezing.   Cardiovascular: Negative for chest pain, palpitations and leg swelling.  Gastrointestinal: Negative for blood in stool, constipation, diarrhea, nausea and vomiting.  Endocrine: Negative for cold intolerance, heat intolerance and polyuria.  Genitourinary: Positive for dyspareunia. Negative for dysuria, flank pain, frequency, genital sores, hematuria, menstrual problem, pelvic pain, urgency, vaginal bleeding, vaginal discharge and vaginal pain.  Musculoskeletal: Negative for back pain, joint swelling and myalgias.  Skin: Negative for rash.  Neurological: Negative for dizziness, syncope, light-headedness, numbness and headaches.  Hematological: Negative for adenopathy.  Psychiatric/Behavioral: Positive for agitation. Negative for confusion, sleep disturbance and suicidal ideas. The patient is not nervous/anxious.   BREAST: No symptoms    Objective: BP 110/80   Ht $R'5\' 3"'sE$  (1.6 m)   Wt 195 lb (88.5 kg)   BMI 34.54 kg/m    Physical  Exam Constitutional:      Appearance: She is well-developed.  Genitourinary:     Vagina and uterus normal.     No vaginal discharge, erythema or tenderness.     No cervical motion tenderness or polyp.     Uterus is not enlarged or tender.     No right or left adnexal mass present.     Right adnexa not tender.     Left adnexa not tender.  Neck:     Thyroid: No thyromegaly.  Cardiovascular:     Rate and Rhythm: Normal rate and regular rhythm.     Heart sounds: Normal heart sounds. No  murmur heard.   Pulmonary:     Effort: Pulmonary effort is normal.     Breath sounds: Normal breath sounds.  Chest:     Breasts:        Right: No mass, nipple discharge, skin change or tenderness.        Left: No mass, nipple discharge, skin change or tenderness.  Abdominal:     Palpations: Abdomen is soft.     Tenderness: There is no abdominal tenderness. There is no guarding.  Musculoskeletal:        General: Normal range of motion.     Cervical back: Normal range of motion.  Neurological:     Mental Status: She is alert and oriented to person, place, and time.     Cranial Nerves: No cranial nerve deficit.  Psychiatric:        Behavior: Behavior normal.  Vitals reviewed.     Assessment/Plan:  Encounter for annual routine gynecological examination  Encounter for screening mammogram for malignant neoplasm of breast - Plan: MM 3D SCREEN BREAST BILATERAL; pt to sched mammo  Essential hypertension--doing well on lisinopril 10 mg. Will have PCP manage dx/Rx.   Postmenopausal atrophic vaginitis - Rx RF estrace crm.  - Plan: estradiol (ESTRACE) 0.1 MG/GM vaginal cream. Try coconut oil as lubricant.   Meds ordered this encounter  Medications  . estradiol (ESTRACE) 0.1 MG/GM vaginal cream    Sig: Insert 1g once weekly as maintenace    Dispense:  42.5 g    Refill:  1    Order Specific Question:   Supervising Provider    Answer:   Gae Dry [[id number]]           GYN counsel breast self  exam, mammography screening, menopause, adequate intake of calcium and vitamin D, diet and exercise    F/U  Return in about 1 year (around 02/16/2021).  Kenzy Campoverde B. Allicia Culley, PA-C 02/17/2020 11:31 AM

## 2020-02-17 ENCOUNTER — Ambulatory Visit (INDEPENDENT_AMBULATORY_CARE_PROVIDER_SITE_OTHER): Payer: Managed Care, Other (non HMO) | Admitting: Obstetrics and Gynecology

## 2020-02-17 ENCOUNTER — Other Ambulatory Visit: Payer: Self-pay

## 2020-02-17 ENCOUNTER — Encounter: Payer: Self-pay | Admitting: Obstetrics and Gynecology

## 2020-02-17 VITALS — BP 110/80 | Ht 63.0 in | Wt 195.0 lb

## 2020-02-17 DIAGNOSIS — I10 Essential (primary) hypertension: Secondary | ICD-10-CM | POA: Diagnosis not present

## 2020-02-17 DIAGNOSIS — Z01419 Encounter for gynecological examination (general) (routine) without abnormal findings: Secondary | ICD-10-CM

## 2020-02-17 DIAGNOSIS — Z1231 Encounter for screening mammogram for malignant neoplasm of breast: Secondary | ICD-10-CM | POA: Diagnosis not present

## 2020-02-17 DIAGNOSIS — N952 Postmenopausal atrophic vaginitis: Secondary | ICD-10-CM

## 2020-02-17 MED ORDER — ESTRADIOL 0.1 MG/GM VA CREA: TOPICAL_CREAM | VAGINAL | 1 refills | Status: DC

## 2020-02-17 NOTE — Patient Instructions (Signed)
I value your feedback and entrusting us with your care. If you get a [facility] patient survey, I would appreciate you taking the time to let us know about your experience today. Thank you!  As of June 06, 2019, your lab results will be released to your MyChart immediately, before I even have a chance to see them. Please give me time to review them and contact you if there are any abnormalities. Thank you for your patience.  

## 2020-02-24 NOTE — Progress Notes (Signed)
Patient ID: Lauren HendersonSandra H Vasquez, female    DOB: 02/09/1961, 59 y.o.   MRN: [id number]  PCP: Lauren Vasquez, Lauren Hogston D, MD  Chief Complaint  Patient presents with  . Follow-up    Subjective:   Lauren HendersonSandra H Vasquez is a 59 y.o. female, presents to clinic with CC of the following:  Chief Complaint  Patient presents with  . Follow-up    HPI:  Patient is a 59 year old female Last visit with me was 01/21/2020 Communication after the lab results were obtained after that visit was as follows:  The TSH (thyroid screen) was normal at 1.89 The complete metabolic panel showed a slightly high glucose at 104 with the remainder of the panel completely normal. The hemoglobin A1c was 5.7, lower than 5.9 about a year ago, and still just falling in the prediabetes range. The complete blood count was all good. The cholesterol panel was slightly worse with the cholesterol high at 211 and the LDL cholesterol (lousy type) higher at 138. The desired range is an LDL cholesterol less than 846100, and if diabetic, desired range is less than 70. will hold off on recommending adding a medicine to help lower the cholesterol presently, and have included nonpharmaceutical recommendations at the end of this message to help lower the cholesterol and LDL cholesterol. If these numbers continue to increase over time, likely will recommend starting a medicine to help lower the cholesterol at that time. Follows up today.  Lexapro was added on that last visit with me, with concerns her blood pressure may be borderline high as well.  She did have her annual woman's health evaluation 02/17/2020 -her blood pressure was very good on that visit.  Hyperlipidemia + history of borderline hyperlipidemia Not on medications presently, noted trying to do a better job with diet modifications recently  Lab Results  Component Value Date   CHOL 211 (H) 01/21/2020   HDL 49 (L) 01/21/2020   LDLCALC 138 (H) 01/21/2020   TRIG 121  01/21/2020   CHOLHDL 4.3 01/21/2020     Pre-DM Lab Results  Component Value Date   HGBA1C 5.7 (H) 01/21/2020   HGBA1C 5.9 (H) 02/20/2019   HGBA1C 5.6 04/09/2018   Lab Results  Component Value Date   LDLCALC 138 (H) 01/21/2020   CREATININE 0.72 01/21/2020    HTN  BP Readings from Last 3 Encounters:  02/25/20 124/82  02/17/20 110/80  01/21/20 (!) 138/92    Medication regimen -lisinopril 10 mg. Takes regularly Does check BP's at home- normally 120-140/70-85, better in recent past No chest pains, palpitations, shortness of breath, lower extremity swelling, increased headaches or vision changes.  Obesity BMI's have been over 30 in the recent past. Weight has remained relatively stable in the past couple years.  Not had much success trying to lose weight.  Weight is down just a few pounds in the very recent past and encouraged continued efforts.  Exercise - Used to do a lot of walking, lately not do muchexercise at all and not significantly increased recent past.  Continue to encourage more exercise but difficult with caring for Dad Diet - Tries to eat a healthy diet  Wt Readings from Last 3 Encounters:  02/25/20 191 lb 8 oz (86.9 kg)  02/17/20 195 lb (88.5 kg)  01/21/20 194 lb 9.6 oz (88.3 kg)    Generalized anxiety/mild depression- stresses remain increased,taking care of 58 year old fatherwho now lives with her, and daughter/husband and child had moved inas well after they  sold their house, then closed on a new house, and since last visit, have moved out. Sister and brother not want to help with issues still.  She denied any major depressive symptoms, stated feels more anxious than depressed. Again has some days that are better than others, and does have occasional panic episodes, where she can feel her heart beating faster at times, although does not persist, and denies any frank chest pains when this occurs. States still not interested in counseling. Has  support form those at home presently.  Medication regimen-Lexapro 10 mg daily Since starting the Lexapro last visit, seems like is better, less panic episodes.  All in all, seems like stresses are handling better by her report.  She had no side effects when starting the medicine, and continues to tolerate without concern  To review:   She was seen 02/17/2020 by her OB/GYN for an annual physical: Noted patient is postmenopausal.  Also noted she doeshave vaginal drynessand uses lubricants andestrace crm with sx improvement. Willcontinue to be followed byOB/gyn for woman's health issues.   Last Pap: 10/30/17  Results were: no abnormalities /neg HPV DNA.   Hx of STDs: none   Last mammogram: 02/26/19  Results were: normal--routine follow-up in 12 months  There is no FH of breast cancer. There is a FH of ovarian cancer in her PGM. Pt is MyRisk neg 2016.  The patient does do self-breast exams.   Colonoscopy: colonoscopy 2018 without abnormalities with Dr. Mechele Collin.  Repeat due after 5 years due to hx of polyps in past, positive family history of colon cancer-fathe ( diagnosed approximately age 68)   She does get adequate calcium and Vitamin Vasquez in her diet.    Tobacco use: The patient denies current or previous tobacco use. Alcohol use: social drinker, not increased recent past Had Covid Vaccine    Patient Active Problem List   Diagnosis Date Noted  . Generalized anxiety disorder 01/21/2020  . Class 1 obesity due to excess calories with serious comorbidity and body mass index (BMI) of 33.0 to 33.9 in adult 10/16/2019  . Mixed hyperlipidemia 10/16/2019  . Polyp of colon 10/16/2019  . Seasonal allergic rhinitis due to pollen 10/16/2019  . FH: colon cancer 10/16/2019  . Depression with anxiety 10/16/2019  . Prediabetes 04/09/2018  . Asthma 04/09/2018  . Postmenopausal atrophic vaginitis 04/09/2018  . Acute foot pain 04/09/2018  . Essential hypertension 10/30/2017      Current  Outpatient Medications:  .  albuterol (PROVENTIL HFA) 108 (90 Base) MCG/ACT inhaler, Inhale into the lungs every 4 (four) hours as needed. , Disp: , Rfl:  .  escitalopram (LEXAPRO) 10 MG tablet, Take 1 tablet (10 mg total) by mouth daily. Take 1/2 tablet for first week., Disp: 90 tablet, Rfl: 1 .  estradiol (ESTRACE) 0.1 MG/GM vaginal cream, Insert 1g once weekly as maintenace, Disp: 42.5 g, Rfl: 1 .  lisinopril (ZESTRIL) 10 MG tablet, Take 1 tablet (10 mg total) by mouth daily., Disp: 90 tablet, Rfl: 3 .  RETIN-A MICRO PUMP 0.08 % GEL, Apply topically at bedtime., Disp: , Rfl:    Allergies  Allergen Reactions  . Shrimp [Shellfish Allergy]   . Sulfites      Past Surgical History:  Procedure Laterality Date  . CESAREAN SECTION  1991/1992  . COLONOSCOPY  2018   [facility], repeat after 5 yrs  . LYMPHADENECTOMY  1985  . TMJ ARTHROPLASTY  1989     Family History  Problem Relation Age of  Onset  . Prostate cancer Father   . Skin cancer Father   . Colon cancer Father   . Hypertension Father   . Skin cancer Brother   . Lung cancer Maternal Grandfather   . Ovarian cancer Paternal Grandmother   . Hypertension Mother   . Hypertension Sister   . Breast cancer Neg Hx      Social History   Tobacco Use  . Smoking status: Former Games developer  . Smokeless tobacco: Never Used  . Tobacco comment: quit ~ 2000  Substance Use Topics  . Alcohol use: Yes    Comment: ocasionally     With staff assistance, above reviewed with the patient today.  ROS: As per HPI, otherwise no specific complaints on a limited and focused system review   No results found for this or any previous visit (from the past 72 hour(s)).   PHQ2/9: Depression screen [facility] 2/9 02/25/2020 01/21/2020 10/16/2019  Decreased Interest 0 - 1  Down, Depressed, Hopeless 1 1 0  PHQ - 2 Score 1 1 1   Altered sleeping 2 1 1   Tired, decreased energy 1 0 3  Change in appetite 0 0 0  Feeling bad or failure about yourself  1 1 1   Trouble  concentrating 0 1 1  Moving slowly or fidgety/restless 0 0 0  Suicidal thoughts 0 0 0  PHQ-9 Score 5 4 7   Difficult doing work/chores Somewhat difficult Somewhat difficult Somewhat difficult   PHQ-2/9 Result reviewed    Fall Risk: Fall Risk  02/25/2020 01/21/2020 10/16/2019  Falls in the past year? 0 0 1  Number falls in past yr: 0 0 0  Injury with Fall? 0 0 0      Objective:   Vitals:   02/25/20 1324  BP: 124/82  Pulse: 95  Resp: 16  Temp: 98.1 F (36.7 C)  TempSrc: Oral  SpO2: 100%  Weight: 191 lb 8 oz (86.9 kg)  Height: 5\' 3"  (1.6 m)    Body mass index is 33.92 kg/m.  Physical Exam   NAD, masked,pleasant,  appears less distressed than last exam HEENT - [place]/AT, sclera anicteric, PERRL, EOMI, conj - non-inj'ed, pharynx clear Neck - supple,no adenopathy, no TM, carotids 2+ and = without bruits bilat Car - RRR without m/g/r Pulm- RR and effort normal at rest, CTA without wheeze or rales Abd - soft,obese,NT diffusely, Back - no CVA tenderness Ext - no LE edema,  Neuro/psychiatric - affect was not flat, appropriate with conversation Alert and oriented Grossly non-focal - good strength on testing extremitiesincluding good grip strength bilateral, sensation intact to LT in distal extremities, Speech normal   Results for orders placed or performed in visit on 01/21/20  TSH  Result Value Ref Range   TSH 1.89 0.40 - 4.50 mIU/L  Lipid panel  Result Value Ref Range   Cholesterol 211 (H) <200 mg/dL   HDL 49 (L) > OR = 50 mg/dL   Triglycerides 01/23/2020 10/18/2019 mg/dL   LDL Cholesterol (Calc) 138 (H) mg/dL (calc)   Total CHOL/HDL Ratio 4.3 <5.0 (calc)   Non-HDL Cholesterol (Calc) 162 (H) <130 mg/dL (calc)  COMPLETE METABOLIC PANEL WITH GFR  Result Value Ref Range   Glucose, Bld 104 (H) 65 - 99 mg/dL   BUN 12 7 - 25 mg/dL   Creat 02/27/20 - 01/23/20 mg/dL   GFR, Est Non African American 92 > OR = 60 mL/min/1.71m2   GFR, Est African  American 107 > OR = 60 mL/min/1.69m2  BUN/Creatinine Ratio NOT APPLICABLE 6 - 22 (calc)   Sodium 141 135 - 146 mmol/L   Potassium 4.6 3.5 - 5.3 mmol/L   Chloride 102 98 - 110 mmol/L   CO2 27 20 - 32 mmol/L   Calcium 9.7 8.6 - 10.4 mg/dL   Total Protein 7.2 6.1 - 8.1 g/dL   Albumin 4.8 3.6 - 5.1 g/dL   Globulin 2.4 1.9 - 3.7 g/dL (calc)   AG Ratio 2.0 1.0 - 2.5 (calc)   Total Bilirubin 0.5 0.2 - 1.2 mg/dL   Alkaline phosphatase (APISO) 71 37 - 153 U/L   AST 19 10 - 35 U/L   ALT 23 6 - 29 U/L  Hemoglobin A1c  Result Value Ref Range   Hgb A1c MFr Bld 5.7 (H) <5.7 % of total Hgb   Mean Plasma Glucose 117 (calc)   eAG (mmol/L) 6.5 (calc)  CBC with Differential/Platelet  Result Value Ref Range   WBC 5.6 3.8 - 10.8 Thousand/uL   RBC 4.68 3.80 - 5.10 Million/uL   Hemoglobin 14.9 11.7 - 15.5 g/dL   HCT 97.0 35 - 45 %   MCV 93.4 80.0 - 100.0 fL   MCH 31.8 27.0 - 33.0 pg   MCHC 34.1 32.0 - 36.0 g/dL   RDW 26.3 78.5 - 88.5 %   Platelets 261 140 - 400 Thousand/uL   MPV 8.8 7.5 - 12.5 fL   Neutro Abs 2,727 1,500 - 7,800 cells/uL   Lymphs Abs 2,246 850 - 3,900 cells/uL   Absolute Monocytes 554 200 - 950 cells/uL   Eosinophils Absolute 11 (L) 15 - 500 cells/uL   Basophils Absolute 62 0 - 200 cells/uL   Neutrophils Relative % 48.7 %   Total Lymphocyte 40.1 %   Monocytes Relative 9.9 %   Eosinophils Relative 0.2 %   Basophils Relative 1.1 %   Last labs reviewed Assessment & Plan:   1. Essential hypertension Blood pressure is well controlled presently, on a low-dose of lisinopril Continue the lisinopril presently Also continue the blood pressure checks at home to help monitor.  2. Mixed hyperlipidemia Reviewed last lipid panel, slightly elevated and managing with lifestyle modifications presently.  3. Prediabetes Continuing to monitor, with last check of the A1c still in a prediabetic range noted  4. Generalized anxiety disorder Seems improved with the Lexapro Agreed to  continue the Lexapro presently She still is not interested in counseling presently  5. Depression with anxiety Remains stable, with a PHQ reviewed again today. Continue the Lexapro product.  6. Class 1 obesity due to excess calories with serious comorbidity and body mass index (BMI) of 33.0 to 33.9 in adult The importance of a healthy weight maintenance emphasized today, and she has had some luck losing a little weight in the very recent past. Continue to monitor.   Felt best to schedule follow-up again in about 4 months time, follow-up sooner as needed.   Lauren Haring, MD 02/25/20 1:37 PM

## 2020-02-25 ENCOUNTER — Ambulatory Visit (INDEPENDENT_AMBULATORY_CARE_PROVIDER_SITE_OTHER): Payer: Managed Care, Other (non HMO) | Admitting: Internal Medicine

## 2020-02-25 ENCOUNTER — Encounter: Payer: Self-pay | Admitting: Internal Medicine

## 2020-02-25 ENCOUNTER — Other Ambulatory Visit: Payer: Self-pay

## 2020-02-25 VITALS — BP 124/82 | HR 95 | Temp 98.1°F | Resp 16 | Ht 63.0 in | Wt 191.5 lb

## 2020-02-25 DIAGNOSIS — I10 Essential (primary) hypertension: Secondary | ICD-10-CM

## 2020-02-25 DIAGNOSIS — R7303 Prediabetes: Secondary | ICD-10-CM

## 2020-02-25 DIAGNOSIS — E6609 Other obesity due to excess calories: Secondary | ICD-10-CM

## 2020-02-25 DIAGNOSIS — Z6833 Body mass index (BMI) 33.0-33.9, adult: Secondary | ICD-10-CM

## 2020-02-25 DIAGNOSIS — F418 Other specified anxiety disorders: Secondary | ICD-10-CM

## 2020-02-25 DIAGNOSIS — F411 Generalized anxiety disorder: Secondary | ICD-10-CM

## 2020-02-25 DIAGNOSIS — E66811 Obesity, class 1: Secondary | ICD-10-CM

## 2020-02-25 DIAGNOSIS — E782 Mixed hyperlipidemia: Secondary | ICD-10-CM | POA: Diagnosis not present

## 2020-02-25 NOTE — Patient Instructions (Signed)

## 2020-03-01 ENCOUNTER — Encounter: Payer: Self-pay | Admitting: Internal Medicine

## 2020-03-02 ENCOUNTER — Other Ambulatory Visit: Payer: Self-pay | Admitting: Obstetrics and Gynecology

## 2020-03-02 DIAGNOSIS — I10 Essential (primary) hypertension: Secondary | ICD-10-CM

## 2020-03-24 ENCOUNTER — Other Ambulatory Visit: Payer: Self-pay

## 2020-03-24 ENCOUNTER — Ambulatory Visit
Admission: RE | Admit: 2020-03-24 | Discharge: 2020-03-24 | Disposition: A | Discharge status: Home / Self Care | Payer: Managed Care, Other (non HMO) | Source: Ambulatory Visit | Attending: Obstetrics and Gynecology | Admitting: Obstetrics and Gynecology

## 2020-03-24 DIAGNOSIS — Z1231 Encounter for screening mammogram for malignant neoplasm of breast: Secondary | ICD-10-CM

## 2020-03-30 ENCOUNTER — Inpatient Hospital Stay
Admission: RE | Admit: 2020-03-30 | Discharge: 2020-03-30 | Disposition: A | Discharge status: Home / Self Care | Payer: Self-pay | Source: Ambulatory Visit | Attending: *Deleted | Admitting: *Deleted

## 2020-03-30 ENCOUNTER — Other Ambulatory Visit: Payer: Self-pay | Admitting: *Deleted

## 2020-03-30 DIAGNOSIS — Z1231 Encounter for screening mammogram for malignant neoplasm of breast: Secondary | ICD-10-CM

## 2020-04-28 ENCOUNTER — Encounter: Payer: Self-pay | Admitting: Obstetrics and Gynecology

## 2020-05-20 ENCOUNTER — Other Ambulatory Visit: Payer: Self-pay | Admitting: Obstetrics and Gynecology

## 2020-05-20 ENCOUNTER — Other Ambulatory Visit: Payer: Self-pay | Admitting: Internal Medicine

## 2020-05-20 ENCOUNTER — Other Ambulatory Visit: Payer: Self-pay | Admitting: Family Medicine

## 2020-05-20 DIAGNOSIS — I10 Essential (primary) hypertension: Secondary | ICD-10-CM

## 2020-05-20 MED ORDER — LISINOPRIL 10 MG PO TABS: 10.0000 mg | ORAL_TABLET | Freq: Every day | ORAL | 1 refills | Status: DC

## 2020-05-20 NOTE — Telephone Encounter (Signed)
Copied from CRM [id number]. Topic: Quick Communication - Rx Refill/Question >> May 20, 2020  9:30 AM Jaquita Rector A wrote: Medication: lisinopril (ZESTRIL) 10 MG tablet   Has the patient contacted their pharmacy? Yes.   (Agent: If no, request that the patient contact the pharmacy for the refill.) (Agent: If yes, when and what did the pharmacy advise?)  Preferred Pharmacy (with phone number or street name): [id number] - [place], [place] - [address]  Phone:  [phone] Fax:  [phone]     Agent: Please be advised that RX refills may take up to 3 business days. We ask that you follow-up with your pharmacy.

## 2020-05-20 NOTE — Telephone Encounter (Signed)
Requested medication (s) are due for refill today: yes  Requested medication (s) are on the active medication list: yes  Last refill:  03/02/20  Future visit scheduled: yes  Notes to clinic:  Please review for refill. Previously filled by another provider    Requested Prescriptions  Pending Prescriptions Disp Refills   lisinopril (ZESTRIL) 10 MG tablet 90 tablet 0    Sig: Take 1 tablet (10 mg total) by mouth daily.      Cardiovascular:  ACE Inhibitors Passed - 05/20/2020  9:36 AM      Passed - Cr in normal range and within 180 days    Creat  Date Value Ref Range Status  01/21/2020 0.72 0.50 - 1.05 mg/dL Final    Comment:    For patients >11 years of age, the reference limit for Creatinine is approximately 13% higher for people identified as African-American. .           Passed - K in normal range and within 180 days    Potassium  Date Value Ref Range Status  01/21/2020 4.6 3.5 - 5.3 mmol/L Final          Passed - Patient is not pregnant      Passed - Last BP in normal range    BP Readings from Last 1 Encounters:  02/25/20 124/82          Passed - Valid encounter within last 6 months    Recent Outpatient Visits           2 months ago Essential hypertension   [facility] Jamelle Haring, MD   4 months ago Essential hypertension   [facility] Jamelle Haring, MD   7 months ago Essential hypertension   [facility] Jamelle Haring, MD       Future Appointments             In 1 month Jamelle Haring, MD [facility]

## 2020-07-03 NOTE — Progress Notes (Signed)
Patient ID: Lauren Vasquez, female    DOB: 1960/12/22, 60 y.o.   MRN: [id number]  PCP: Jamelle Haring, MD  Chief Complaint  Patient presents with  . Follow-up    Subjective:   Lauren Vasquez is a 60 y.o. female, presents to clinic with CC of the following:  Chief Complaint  Patient presents with  . Follow-up    HPI:  Last visit with me was 08/31//2021 Communication after the lab results were obtained after her July visit was as follows:             The TSH (thyroid screen) was normal at 1.89 The complete metabolic panel showed a slightly high glucose at 104 with the remainder of the panel completely normal. The hemoglobin A1c was 5.7, lower than 5.9 about a year ago, and still just falling in the prediabetes range. The complete blood count was all good. The cholesterol panel was slightly worse with the cholesterol high at 211 and the LDL cholesterol (lousy type) higher at 138. The desired range is an LDL cholesterol less than 681, and if diabetic, desired range is less than 70. will hold off on recommending adding a medicine to help lower the cholesterol presently, and have included nonpharmaceutical recommendations at the end of this message to help lower the cholesterol and LDL cholesterol. If these numbers continue to increase over time, likely will recommend starting a medicine to help lower the cholesterol at that time.  Follows up today. All in all, things have been going fairly well.  Lexapro was added on her July visit and she noted was feeling better and was helpful last visit  She did have her annual woman's health evaluation 02/17/2020  Hyperlipidemia + history of borderline hyperlipidemia Not on medications presently, noted trying to do a better job with diet modifications recently  Lab Results  Component Value Date   CHOL 211 (H) 01/21/2020   HDL 49 (L) 01/21/2020   LDLCALC 138 (H) 01/21/2020   TRIG 121 01/21/2020   CHOLHDL 4.3 01/21/2020      Pre-DM Lab Results  Component Value Date   HGBA1C 5.7 (H) 01/21/2020   HGBA1C 5.9 (H) 02/20/2019   HGBA1C 5.6 04/09/2018   Lab Results  Component Value Date   LDLCALC 138 (H) 01/21/2020   CREATININE 0.72 01/21/2020    Last check showed a slightly improved A1C  HTN BP Readings from Last 3 Encounters:  07/07/20 (!) 160/60  02/25/20 124/82  02/17/20 110/80    Medication regimen -lisinopril 10 mg. Takes regularly Does check BP's at home- normally 120-140/70-80,  No chest pains, palpitations, shortness of breath, lower extremity swelling, increased headaches or vision changes.  Obesity Wt Readings from Last 3 Encounters:  07/07/20 195 lb 4.8 oz (88.6 kg)  02/25/20 191 lb 8 oz (86.9 kg)  02/17/20 195 lb (88.5 kg)   BMI's have been over 30 in the recent past. Weight has remained relatively stable in the past couple years.Not had much success trying to lose weight.  Exercise -Used to do a lot of walking, lately not do muchexercise at all and not significantly increased recent past.  Continue to encouragemore exercise but difficult with caring for Dad, is a goal in the new year to get more active Diet - Tries to eat a healthydiet   Generalized anxiety/mild depression- stresses remain increased,taking care of 59 year old fatherwho now lives with her,Sister and brother not want to help with issues still.  Notes a  lot of stressors with taking care of the father.  Sister has been little more helpful recently She denied any major depressive symptoms, stated feels more anxious than depressed.  Again has some days that are better than others,  Entertained counseling in the past although she was not interested  Medication regimen-Lexapro 10 mg daily Since starting the Lexapro, remains better.    Some days, she thinks it is not all that helpful.   She had no side effects when starting the medicine, and continues to tolerate without concern  To  review: She was seen 02/17/2020 by her OB/GYN for an annual physical: Noted patient is postmenopausal.  Also noted she doeshave vaginal drynessand uses lubricants andestrace crm with sx improvement. Willcontinue to be followed byOB/gyn for woman's health issues.              Last Pap: 10/30/17 Results were: no abnormalities/neg HPV DNA.              Hx of STDs: none              Last mammogram:9/2021Results were: normal--routine follow-up in 12 months              Colonoscopy: colonoscopy2018withoutabnormalities with Dr. Mechele Collin.Repeat due after 5 years due to hx of polyps in past, positive family history of colon cancer-fathe ( diagnosed approximately age 40)   Tobacco use: The patient denies current or previous tobacco use. Alcohol use: social drinker,not increased recent past Had Covid Vaccine        Patient Active Problem List   Diagnosis Date Noted  . Generalized anxiety disorder 01/21/2020  . Class 1 obesity due to excess calories with serious comorbidity and body mass index (BMI) of 33.0 to 33.9 in adult 10/16/2019  . Mixed hyperlipidemia 10/16/2019  . Polyp of colon 10/16/2019  . Seasonal allergic rhinitis due to pollen 10/16/2019  . FH: colon cancer 10/16/2019  . Depression with anxiety 10/16/2019  . Prediabetes 04/09/2018  . Asthma 04/09/2018  . Postmenopausal atrophic vaginitis 04/09/2018  . Acute foot pain 04/09/2018  . Essential hypertension 10/30/2017      Current Outpatient Medications:  .  albuterol (VENTOLIN HFA) 108 (90 Base) MCG/ACT inhaler, Inhale into the lungs every 4 (four) hours as needed. , Disp: , Rfl:  .  escitalopram (LEXAPRO) 10 MG tablet, Take 1 tablet (10 mg total) by mouth daily. Take 1/2 tablet for first week., Disp: 90 tablet, Rfl: 1 .  estradiol (ESTRACE) 0.1 MG/GM vaginal cream, Insert 1g once weekly as maintenace, Disp: 42.5 g, Rfl: 1 .  hydrOXYzine (ATARAX/VISTARIL) 10 MG tablet, Take 1 tablet (10 mg total) by  mouth 2 (two) times daily as needed (may make drowsy)., Disp: 60 tablet, Rfl: 1 .  lisinopril (ZESTRIL) 10 MG tablet, Take 1 tablet (10 mg total) by mouth daily., Disp: 90 tablet, Rfl: 1 .  RETIN-A MICRO PUMP 0.08 % GEL, Apply topically at bedtime., Disp: , Rfl:    Allergies  Allergen Reactions  . Shrimp [Shellfish Allergy]   . Sulfites      Past Surgical History:  Procedure Laterality Date  . CESAREAN SECTION  1991/1992  . COLONOSCOPY  2018   [facility], repeat after 5 yrs  . LYMPHADENECTOMY  1985  . TMJ ARTHROPLASTY  1989     Family History  Problem Relation Age of Onset  . Prostate cancer Father   . Skin cancer Father   . Colon cancer Father   . Hypertension Father   .  Skin cancer Brother   . Lung cancer Maternal Grandfather   . Ovarian cancer Paternal Grandmother   . Hypertension Mother   . Hypertension Sister   . Breast cancer Neg Hx      Social History   Tobacco Use  . Smoking status: Former Games developer  . Smokeless tobacco: Never Used  . Tobacco comment: quit ~ 2000  Substance Use Topics  . Alcohol use: Yes    Comment: ocasionally     With staff assistance, above reviewed with the patient today.  ROS: As per HPI, otherwise no specific complaints on a limited and focused system review   No results found for this or any previous visit (from the past 72 hour(s)).   PHQ2/9: Depression screen [facility] 2/9 07/07/2020 02/25/2020 01/21/2020 10/16/2019  Decreased Interest 1 0 - 1  Down, Depressed, Hopeless 1 1 1  0  PHQ - 2 Score 2 1 1 1   Altered sleeping 2 2 1 1   Tired, decreased energy 3 1 0 3  Change in appetite 0 0 0 0  Feeling bad or failure about yourself  0 1 1 1   Trouble concentrating 0 0 1 1  Moving slowly or fidgety/restless 0 0 0 0  Suicidal thoughts 0 0 0 0  PHQ-9 Score 7 5 4 7   Difficult doing work/chores Somewhat difficult Somewhat difficult Somewhat difficult Somewhat difficult   PHQ-2/9 Result reviewed  GAD 7 : Generalized Anxiety Score 07/07/2020  10/16/2019  Nervous, Anxious, on Edge 3 2  Control/stop worrying 1 1  Worry too much - different things 1 0  Trouble relaxing 3 0  Restless 1 0  Easily annoyed or irritable 2 1  Afraid - awful might happen 1 1  Total GAD 7 Score 12 5  Anxiety Difficulty Somewhat difficult Somewhat difficult    Result reviewed -she noted today she has had more stresses   Fall Risk: Fall Risk  07/07/2020 02/25/2020 01/21/2020 10/16/2019  Falls in the past year? 0 0 0 1  Number falls in past yr: 0 0 0 0  Injury with Fall? 0 0 0 0      Objective:   Vitals:   07/07/20 1314  BP: (!) 160/60  Pulse: 97  Resp: 16  Temp: 98.4 F (36.9 C)  TempSrc: Oral  SpO2: 98%  Weight: 195 lb 4.8 oz (88.6 kg)  Height: 5\' 3"  (1.6 m)    Body mass index is 34.6 kg/m.  Physical Exam  Recheck blood pressure by myself was 142/65 on the left with a large adult cuff  NAD, masked,pleasant,  HEENT - [place]/AT, sclera anicteric, PERRL, EOMI, conj - non-inj'ed, pharynx clear Neck - supple,no adenopathy, no TM, carotids 2+ and = without bruits bilat Car - RRR without m/g/r Pulm- RR and effort normal at rest, CTA without wheeze or rales Abd - soft,obese,NTdiffusely, Back - no CVA tenderness Ext - no LE edema,  Neuro/psychiatric - affect was not flat, appropriate with conversation Alert  Grossly non-focal Speech normal  Results for orders placed or performed in visit on 01/21/20  TSH  Result Value Ref Range   TSH 1.89 0.40 - 4.50 mIU/L  Lipid panel  Result Value Ref Range   Cholesterol 211 (H) <200 mg/dL   HDL 49 (L) > OR = 50 mg/dL   Triglycerides 02/27/2020 01/23/2020 mg/dL   LDL Cholesterol (Calc) 138 (H) mg/dL (calc)   Total CHOL/HDL Ratio 4.3 <5.0 (calc)   Non-HDL Cholesterol (Calc) 162 (H) <130 mg/dL (calc)  COMPLETE METABOLIC  PANEL WITH GFR  Result Value Ref Range   Glucose, Bld 104 (H) 65 - 99 mg/dL   BUN 12 7 - 25 mg/dL   Creat 0.72 0.50 - 1.05 mg/dL   GFR, Est Non  African American 92 > OR = 60 mL/min/1.54m2   GFR, Est African American 107 > OR = 60 mL/min/1.58m2   BUN/Creatinine Ratio NOT APPLICABLE 6 - 22 (calc)   Sodium 141 135 - 146 mmol/L   Potassium 4.6 3.5 - 5.3 mmol/L   Chloride 102 98 - 110 mmol/L   CO2 27 20 - 32 mmol/L   Calcium 9.7 8.6 - 10.4 mg/dL   Total Protein 7.2 6.1 - 8.1 g/dL   Albumin 4.8 3.6 - 5.1 g/dL   Globulin 2.4 1.9 - 3.7 g/dL (calc)   AG Ratio 2.0 1.0 - 2.5 (calc)   Total Bilirubin 0.5 0.2 - 1.2 mg/dL   Alkaline phosphatase (APISO) 71 37 - 153 U/L   AST 19 10 - 35 U/L   ALT 23 6 - 29 U/L  Hemoglobin A1c  Result Value Ref Range   Hgb A1c MFr Bld 5.7 (H) <5.7 % of total Hgb   Mean Plasma Glucose 117 (calc)   eAG (mmol/L) 6.5 (calc)  CBC with Differential/Platelet  Result Value Ref Range   WBC 5.6 3.8 - 10.8 Thousand/uL   RBC 4.68 3.80 - 5.10 Million/uL   Hemoglobin 14.9 11.7 - 15.5 g/dL   HCT 43.7 35.0 - 45.0 %   MCV 93.4 80.0 - 100.0 fL   MCH 31.8 27.0 - 33.0 pg   MCHC 34.1 32.0 - 36.0 g/dL   RDW 12.1 11.0 - 15.0 %   Platelets 261 140 - 400 Thousand/uL   MPV 8.8 7.5 - 12.5 fL   Neutro Abs 2,727 1,500 - 7,800 cells/uL   Lymphs Abs 2,246 850 - 3,900 cells/uL   Absolute Monocytes 554 200 - 950 cells/uL   Eosinophils Absolute 11 (L) 15 - 500 cells/uL   Basophils Absolute 62 0 - 200 cells/uL   Neutrophils Relative % 48.7 %   Total Lymphocyte 40.1 %   Monocytes Relative 9.9 %   Eosinophils Relative 0.2 %   Basophils Relative 1.1 %   Last labs reviewed Assessment & Plan:     1. Essential hypertension Blood pressure is well controlled presently on home checks, slightly higher systolic reading today, although she notes it has been a stressful day. Continue the lisinopril presently Not feel increasing her lisinopril dose is indicated presently Also continue the blood pressure checks at home to help monitor.  2. Mixed hyperlipidemia Reviewed last lipid panel, slightly elevated and managing with lifestyle  modifications presently.  3. Prediabetes Continuing to monitor, with last check of the A1c better, still in a prediabetic range noted  4. Generalized anxiety disorder Seems improved with the Lexapro, but admits has some better days than others. Discussed potentially increasing the Lexapro dose, or having any other medicine like hydroxyzine product to use as needed on some of the days that are more problematic Agreed to try the latter, hydroxyzine-10 mg up to twice daily as needed was prescribed.  Warned may make drowsy.   5. Depression with anxiety Remains stable, with a PHQ reviewed again today. Continue the Lexapro product.  6. Class 1 obesity due to excess calories with serious comorbidity and body mass index (BMI) of 33.0 to 33.9 in adult The importance of a healthy weight maintenance noted, and she has remained stable in the recent  past. Continue to monitor.   Will schedule follow-up in approximately 6 months time, sooner as needed and plan to recheck labs again on that follow-up visit. She is aware of the follow-up will be with a new provider as I will be leaving this practice prior to that planned follow-up     Jamelle Haring, MD 07/07/20 1:40 PM

## 2020-07-07 ENCOUNTER — Encounter: Payer: Self-pay | Admitting: Internal Medicine

## 2020-07-07 ENCOUNTER — Ambulatory Visit (INDEPENDENT_AMBULATORY_CARE_PROVIDER_SITE_OTHER): Payer: Managed Care, Other (non HMO) | Admitting: Internal Medicine

## 2020-07-07 ENCOUNTER — Ambulatory Visit: Payer: Managed Care, Other (non HMO) | Admitting: Internal Medicine

## 2020-07-07 ENCOUNTER — Other Ambulatory Visit: Payer: Self-pay

## 2020-07-07 VITALS — BP 160/60 | HR 97 | Temp 98.4°F | Resp 16 | Ht 63.0 in | Wt 195.3 lb

## 2020-07-07 DIAGNOSIS — R7303 Prediabetes: Secondary | ICD-10-CM

## 2020-07-07 DIAGNOSIS — F418 Other specified anxiety disorders: Secondary | ICD-10-CM

## 2020-07-07 DIAGNOSIS — E782 Mixed hyperlipidemia: Secondary | ICD-10-CM | POA: Diagnosis not present

## 2020-07-07 DIAGNOSIS — F411 Generalized anxiety disorder: Secondary | ICD-10-CM | POA: Diagnosis not present

## 2020-07-07 DIAGNOSIS — Z6833 Body mass index (BMI) 33.0-33.9, adult: Secondary | ICD-10-CM

## 2020-07-07 DIAGNOSIS — I10 Essential (primary) hypertension: Secondary | ICD-10-CM | POA: Diagnosis not present

## 2020-07-07 DIAGNOSIS — E6609 Other obesity due to excess calories: Secondary | ICD-10-CM

## 2020-07-07 MED ORDER — HYDROXYZINE HCL 10 MG PO TABS: 10.0000 mg | ORAL_TABLET | Freq: Two times a day (BID) | ORAL | 1 refills | Status: DC | PRN

## 2020-07-23 ENCOUNTER — Other Ambulatory Visit: Payer: Self-pay | Admitting: Internal Medicine

## 2020-07-23 DIAGNOSIS — N952 Postmenopausal atrophic vaginitis: Secondary | ICD-10-CM

## 2020-07-23 DIAGNOSIS — F411 Generalized anxiety disorder: Secondary | ICD-10-CM

## 2020-07-23 DIAGNOSIS — F418 Other specified anxiety disorders: Secondary | ICD-10-CM

## 2020-07-23 MED ORDER — ESCITALOPRAM OXALATE 10 MG PO TABS: 10.0000 mg | ORAL_TABLET | Freq: Every day | ORAL | 1 refills | Status: DC

## 2020-07-23 NOTE — Telephone Encounter (Signed)
Please see previous encounter

## 2020-07-23 NOTE — Telephone Encounter (Signed)
Requested Prescriptions  Pending Prescriptions Disp Refills  . escitalopram (LEXAPRO) 10 MG tablet 90 tablet 1    Sig: Take 1 tablet (10 mg total) by mouth daily. Take 1/2 tablet for first week.     Psychiatry:  Antidepressants - SSRI Passed - 07/23/2020  2:32 PM      Passed - Completed PHQ-2 or PHQ-9 in the last 360 days      Passed - Valid encounter within last 6 months    Recent Outpatient Visits          2 weeks ago Essential hypertension   [facility] Jamelle Haring, MD   4 months ago Essential hypertension   [facility] Jamelle Haring, MD   6 months ago Essential hypertension   [facility] Jamelle Haring, MD   9 months ago Essential hypertension   [facility] Jamelle Haring, MD

## 2020-07-23 NOTE — Telephone Encounter (Signed)
Pt is requesting a refill for escitalopram (LEXAPRO) 10 MG tablet, pt says that she was told by pharmacy that they have requested, not showing in chart.   Pharmacy:  [id number] - [place], [place] - [address] Phone:  [phone]  Fax:  [phone]       [phone]

## 2020-08-18 ENCOUNTER — Other Ambulatory Visit: Payer: Self-pay | Admitting: Obstetrics and Gynecology

## 2020-08-18 DIAGNOSIS — N952 Postmenopausal atrophic vaginitis: Secondary | ICD-10-CM

## 2020-11-23 ENCOUNTER — Other Ambulatory Visit: Payer: Self-pay | Admitting: Family Medicine

## 2020-11-23 DIAGNOSIS — I10 Essential (primary) hypertension: Secondary | ICD-10-CM

## 2020-11-24 NOTE — Telephone Encounter (Signed)
appt sch'd for 6.23.2022 and pt aware of prescription being sent to pharmacy

## 2020-12-17 ENCOUNTER — Ambulatory Visit (INDEPENDENT_AMBULATORY_CARE_PROVIDER_SITE_OTHER): Payer: Managed Care, Other (non HMO) | Admitting: Unknown Physician Specialty

## 2020-12-17 ENCOUNTER — Other Ambulatory Visit: Payer: Self-pay

## 2020-12-17 ENCOUNTER — Encounter: Payer: Self-pay | Admitting: Unknown Physician Specialty

## 2020-12-17 VITALS — BP 124/72 | HR 90 | Temp 98.5°F | Resp 16 | Ht 63.0 in | Wt 196.4 lb

## 2020-12-17 DIAGNOSIS — F418 Other specified anxiety disorders: Secondary | ICD-10-CM

## 2020-12-17 DIAGNOSIS — R7303 Prediabetes: Secondary | ICD-10-CM | POA: Diagnosis not present

## 2020-12-17 DIAGNOSIS — E782 Mixed hyperlipidemia: Secondary | ICD-10-CM

## 2020-12-17 DIAGNOSIS — I10 Essential (primary) hypertension: Secondary | ICD-10-CM

## 2020-12-17 DIAGNOSIS — F411 Generalized anxiety disorder: Secondary | ICD-10-CM

## 2020-12-17 MED ORDER — LISINOPRIL 10 MG PO TABS: 1.0000 | ORAL_TABLET | Freq: Every day | ORAL | 3 refills | Status: DC

## 2020-12-17 MED ORDER — HYDROXYZINE HCL 10 MG PO TABS: 10.0000 mg | ORAL_TABLET | Freq: Two times a day (BID) | ORAL | 1 refills | Status: DC | PRN

## 2020-12-17 MED ORDER — ESCITALOPRAM OXALATE 10 MG PO TABS: 10.0000 mg | ORAL_TABLET | Freq: Every day | ORAL | 1 refills | Status: DC

## 2020-12-17 NOTE — Assessment & Plan Note (Signed)
Stable, continue present medications.   

## 2020-12-17 NOTE — Assessment & Plan Note (Signed)
Check Hgb A1C 

## 2020-12-17 NOTE — Assessment & Plan Note (Signed)
Pt with a lot of caregiver stress.  Recommended counseling to help with drawing boundaries.

## 2020-12-17 NOTE — Assessment & Plan Note (Signed)
LDL is high.  Check lipid panel

## 2020-12-17 NOTE — Patient Instructions (Signed)
[employer]  [employer]  Employee assistance counseling

## 2020-12-17 NOTE — Progress Notes (Signed)
BP 124/72   Pulse 90   Temp 98.5 F (36.9 C) (Oral)   Resp 16   Ht 5\' 3"  (1.6 m)   Wt 196 lb 6.4 oz (89.1 kg)   SpO2 96%   BMI 34.79 kg/m    Subjective:    Patient ID: , female    DOB: [birth year], 60 y.o.   MRN: [id number]  HPI: Lauren Vasquez is a 60 y.o. female  Chief Complaint  Patient presents with   Hypertension   Anxiety   Hypertension Using medications without difficulty Average home BPs   No problems or lightheadedness No chest pain with exertion or shortness of breath No Edema  The 10-year ASCVD risk score 46 DC Denman George., et al., 2013) is: 4.3%   Values used to calculate the score:     Age: 50 years     Sex: Female     Is Non-Hispanic African American: No     Diabetic: No     Tobacco smoker: No     Systolic Blood Pressure: 124 mmHg     Is BP treated: Yes     HDL Cholesterol: 49 mg/dL     Total Cholesterol: 211 mg/dL  Depression/Anxiety Has good days and bad days with caregiver stress and not much help with siblings  Depression screen [facility] 2/9 12/17/2020 07/07/2020 02/25/2020 01/21/2020 10/16/2019  Decreased Interest 0 1 0 - 1  Down, Depressed, Hopeless 0 1 1 1  0  PHQ - 2 Score 0 2 1 1 1   Altered sleeping 0 2 2 1 1   Tired, decreased energy 0 3 1 0 3  Change in appetite 0 0 0 0 0  Feeling bad or failure about yourself  0 0 1 1 1   Trouble concentrating 0 0 0 1 1  Moving slowly or fidgety/restless 0 0 0 0 0  Suicidal thoughts 0 0 0 0 0  PHQ-9 Score 0 7 5 4 7   Difficult doing work/chores Not difficult at all Somewhat difficult Somewhat difficult Somewhat difficult Somewhat difficult   GAD 7 : Generalized Anxiety Score 12/17/2020 07/07/2020 10/16/2019  Nervous, Anxious, on Edge 0 3 2  Control/stop worrying 0 1 1  Worry too much - different things 0 1 0  Trouble relaxing 0 3 0  Restless 0 1 0  Easily annoyed or irritable 0 2 1  Afraid - awful might happen 0 1 1  Total GAD 7 Score 0 12 5  Anxiety Difficulty Not difficult at all Somewhat  difficult Somewhat difficult     Relevant past medical, surgical, family and social history reviewed and updated as indicated. Interim medical history since our last visit reviewed. Allergies and medications reviewed and updated.  Review of Systems  Per HPI unless specifically indicated above     Objective:    BP 124/72   Pulse 90   Temp 98.5 F (36.9 C) (Oral)   Resp 16   Ht 5\' 3"  (1.6 m)   Wt 196 lb 6.4 oz (89.1 kg)   SpO2 96%   BMI 34.79 kg/m   Wt Readings from Last 3 Encounters:  12/17/20 196 lb 6.4 oz (89.1 kg)  07/07/20 195 lb 4.8 oz (88.6 kg)  02/25/20 191 lb 8 oz (86.9 kg)    Physical Exam Constitutional:      General: She is not in acute distress.    Appearance: Normal appearance. She is well-developed.  HENT:     Head: Normocephalic and atraumatic.  Eyes:  General: Lids are normal. No scleral icterus.       Right eye: No discharge.        Left eye: No discharge.     Conjunctiva/sclera: Conjunctivae normal.  Neck:     Vascular: No carotid bruit or JVD.  Cardiovascular:     Rate and Rhythm: Normal rate and regular rhythm.     Heart sounds: Normal heart sounds.  Pulmonary:     Effort: Pulmonary effort is normal. No respiratory distress.     Breath sounds: Normal breath sounds.  Abdominal:     Palpations: There is no hepatomegaly or splenomegaly.  Musculoskeletal:        General: Normal range of motion.     Cervical back: Normal range of motion and neck supple.  Skin:    General: Skin is warm and dry.     Coloration: Skin is not pale.     Findings: No rash.  Neurological:     Mental Status: She is alert and oriented to person, place, and time.  Psychiatric:        Behavior: Behavior normal.        Thought Content: Thought content normal.        Judgment: Judgment normal.       Assessment & Plan:   Problem List Items Addressed This Visit       Unprioritized   Depression with anxiety - Primary    Pt with a lot of caregiver stress.   Recommended counseling to help with drawing boundaries.         Relevant Medications   escitalopram (LEXAPRO) 10 MG tablet   hydrOXYzine (ATARAX/VISTARIL) 10 MG tablet   Other Relevant Orders   COMPLETE METABOLIC PANEL WITH GFR   Essential hypertension    Stable, continue present medications.         Relevant Medications   lisinopril (ZESTRIL) 10 MG tablet   Other Relevant Orders   Lipid panel   Generalized anxiety disorder   Relevant Medications   escitalopram (LEXAPRO) 10 MG tablet   hydrOXYzine (ATARAX/VISTARIL) 10 MG tablet   Mixed hyperlipidemia    LDL is high.  Check lipid panel       Relevant Medications   lisinopril (ZESTRIL) 10 MG tablet   Prediabetes    Check Hgb A1C       Relevant Orders   Hemoglobin A1c     Follow up plan: Return in about 6 months (around 06/18/2021).

## 2020-12-18 LAB — COMPLETE METABOLIC PANEL WITH GFR
AG Ratio: 1.8 (calc) (ref 1.0–2.5)
ALT: 14 U/L (ref 6–29)
AST: 15 U/L (ref 10–35)
Albumin: 4.4 g/dL (ref 3.6–5.1)
Alkaline phosphatase (APISO): 72 U/L (ref 37–153)
BUN: 8 mg/dL (ref 7–25)
CO2: 27 mmol/L (ref 20–32)
Calcium: 9.4 mg/dL (ref 8.6–10.4)
Chloride: 103 mmol/L (ref 98–110)
Creat: 0.61 mg/dL (ref 0.50–1.05)
GFR, Est African American: 115 mL/min/{1.73_m2} (ref >=60)
GFR, Est Non African American: 99 mL/min/{1.73_m2} (ref >=60)
Globulin: 2.5 g/dL (calc) (ref 1.9–3.7)
Glucose, Bld: 107 mg/dL — ABNORMAL HIGH (ref 65–99)
Potassium: 4.2 mmol/L (ref 3.5–5.3)
Sodium: 139 mmol/L (ref 135–146)
Total Bilirubin: 0.5 mg/dL (ref 0.2–1.2)
Total Protein: 6.9 g/dL (ref 6.1–8.1)

## 2020-12-18 LAB — LIPID PANEL
Cholesterol: 208 mg/dL — ABNORMAL HIGH (ref <200)
HDL: 62 mg/dL (ref >=50)
LDL Cholesterol (Calc): 128 mg/dL (calc) — ABNORMAL HIGH
Non-HDL Cholesterol (Calc): 146 mg/dL (calc) — ABNORMAL HIGH (ref <130)
Total CHOL/HDL Ratio: 3.4 (calc) (ref <5.0)
Triglycerides: 82 mg/dL (ref <150)

## 2020-12-18 LAB — HEMOGLOBIN A1C
Hgb A1c MFr Bld: 5.7 % of total Hgb — ABNORMAL HIGH (ref <5.7)
Mean Plasma Glucose: 117 mg/dL
eAG (mmol/L): 6.5 mmol/L

## 2021-02-17 ENCOUNTER — Other Ambulatory Visit: Payer: Self-pay

## 2021-02-17 ENCOUNTER — Encounter: Payer: Self-pay | Admitting: Obstetrics and Gynecology

## 2021-02-17 ENCOUNTER — Ambulatory Visit (INDEPENDENT_AMBULATORY_CARE_PROVIDER_SITE_OTHER): Payer: Managed Care, Other (non HMO) | Admitting: Obstetrics and Gynecology

## 2021-02-17 ENCOUNTER — Other Ambulatory Visit (HOSPITAL_COMMUNITY)
Admission: RE | Admit: 2021-02-17 | Discharge: 2021-02-17 | Disposition: A | Discharge status: Home / Self Care | Payer: Managed Care, Other (non HMO) | Source: Ambulatory Visit | Attending: Obstetrics and Gynecology | Admitting: Obstetrics and Gynecology

## 2021-02-17 VITALS — BP 100/70 | Ht 63.0 in | Wt 197.0 lb

## 2021-02-17 DIAGNOSIS — Z124 Encounter for screening for malignant neoplasm of cervix: Secondary | ICD-10-CM | POA: Insufficient documentation

## 2021-02-17 DIAGNOSIS — Z1231 Encounter for screening mammogram for malignant neoplasm of breast: Secondary | ICD-10-CM | POA: Diagnosis not present

## 2021-02-17 DIAGNOSIS — Z1151 Encounter for screening for human papillomavirus (HPV): Secondary | ICD-10-CM | POA: Diagnosis not present

## 2021-02-17 DIAGNOSIS — Z01419 Encounter for gynecological examination (general) (routine) without abnormal findings: Secondary | ICD-10-CM

## 2021-02-17 DIAGNOSIS — N952 Postmenopausal atrophic vaginitis: Secondary | ICD-10-CM

## 2021-02-17 DIAGNOSIS — N941 Unspecified dyspareunia: Secondary | ICD-10-CM

## 2021-02-17 MED ORDER — ESTRADIOL 0.1 MG/GM VA CREA: TOPICAL_CREAM | VAGINAL | 1 refills | Status: DC

## 2021-02-17 NOTE — Progress Notes (Signed)
PCP: Towanda Malkin, MD   Chief Complaint  Patient presents with   Gynecologic Exam    No concerns     HPI:      Lauren Vasquez is a 60 y.o. [phone] who LMP was No LMP recorded. Patient is postmenopausal., presents today for her annual examination.  Her menses are absent due to menopause. She does not have PMB. She does have infrequent vasomotor sx.   Sex activity: single partner, contraception - post menopausal status. She does have vaginal dryness and uses lubricants and estrace crm 1 g wkly with some sx improvement.   Last Pap: 10/30/17  Results were: no abnormalities /neg HPV DNA.  Hx of STDs: none  Last mammogram: 03/24/20  Results were: normal--routine follow-up in 12 months There is no FH of breast cancer. There is a FH of ovarian cancer in her PGM and colon cancer in her father. Pt is MyRisk neg 2016.  The patient does self-breast exams.  Colonoscopy: colonoscopy 2018 without abnormalities with Dr. Vira Agar.  Repeat due after 5 years due to hx of polyps in past/FH colon cancer.  Tobacco use: The patient denies current or previous tobacco use. Alcohol use: few drinks wkly No drug use  Exercise: moderately active  She does get adequate calcium and Vitamin D in her diet.  Labs with PCP 7/21 and meds with PCP.  Past Medical History:  Diagnosis Date   Allergic rhinitis    Asthma    BRCA negative 2016   MyRisk neg   Essential hypertension    Family history of ovarian cancer 2016   MyRisk neg   Pre-diabetes 2019    Past Surgical History:  Procedure Laterality Date   CESAREAN SECTION  1991/1992   COLONOSCOPY  2018   [facility], repeat after 5 yrs   LYMPHADENECTOMY  1985   TMJ ARTHROPLASTY  1989    Family History  Problem Relation Age of Onset   Prostate cancer Father    Skin cancer Father    Colon cancer Father    Hypertension Father    Skin cancer Brother    Lung cancer Maternal Grandfather    Ovarian cancer Paternal Grandmother    Hypertension  Mother    Hypertension Sister    Breast cancer Neg Hx     Social History   Socioeconomic History   Marital status: Married    Spouse name: Not on file   Number of children: Not on file   Years of education: Not on file   Highest education level: Not on file  Occupational History   Not on file  Tobacco Use   Smoking status: Former   Smokeless tobacco: Never   Tobacco comments:    quit ~ 2000  Vaping Use   Vaping Use: Never used  Substance and Sexual Activity   Alcohol use: Yes    Comment: ocasionally    Drug use: Never   Sexual activity: Yes    Birth control/protection: Post-menopausal  Other Topics Concern   Not on file  Social History Narrative   Married.   2 children.    Lives in [place].    Enjoys traveling.    Social Determinants of Health   Financial Resource Strain: Not on file  Food Insecurity: Not on file  Transportation Needs: Not on file  Physical Activity: Not on file  Stress: Not on file  Social Connections: Not on file  Intimate Partner Violence: Not on file    Outpatient Medications  Prior to Visit  Medication Sig Dispense Refill   albuterol (VENTOLIN HFA) 108 (90 Base) MCG/ACT inhaler Inhale into the lungs every 4 (four) hours as needed.      escitalopram (LEXAPRO) 10 MG tablet Take 1 tablet (10 mg total) by mouth daily. Take 1/2 tablet for first week. 90 tablet 1   hydrOXYzine (ATARAX/VISTARIL) 10 MG tablet Take 1 tablet (10 mg total) by mouth 2 (two) times daily as needed (may make drowsy). 60 tablet 1   lisinopril (ZESTRIL) 10 MG tablet Take 1 tablet (10 mg total) by mouth daily. 90 tablet 3   RETIN-A MICRO PUMP 0.08 % GEL Apply topically at bedtime.     estradiol (ESTRACE) 0.1 MG/GM vaginal cream Insert 1g once weekly as maintenace 42.5 g 1   No facility-administered medications prior to visit.    ROS:  Review of Systems  Constitutional:  Negative for fatigue, fever and unexpected weight change.  Respiratory:  Negative for cough,  shortness of breath and wheezing.   Cardiovascular:  Negative for chest pain, palpitations and leg swelling.  Gastrointestinal:  Negative for blood in stool, constipation, diarrhea, nausea and vomiting.  Endocrine: Negative for cold intolerance, heat intolerance and polyuria.  Genitourinary:  Negative for dyspareunia, dysuria, flank pain, frequency, genital sores, hematuria, menstrual problem, pelvic pain, urgency, vaginal bleeding, vaginal discharge and vaginal pain.  Musculoskeletal:  Negative for back pain, joint swelling and myalgias.  Skin:  Negative for rash.  Neurological:  Negative for dizziness, syncope, light-headedness, numbness and headaches.  Hematological:  Negative for adenopathy.  Psychiatric/Behavioral:  Positive for agitation. Negative for confusion, sleep disturbance and suicidal ideas. The patient is not nervous/anxious.  BREAST: No symptoms    Objective: BP 100/70   Ht '5\' 3"'  (1.6 m)   Wt 197 lb (89.4 kg)   BMI 34.90 kg/m    Physical Exam Constitutional:      Appearance: She is well-developed.  Genitourinary:     Vulva normal.     Right Labia: No rash, tenderness or lesions.    Left Labia: No tenderness, lesions or rash.    No vaginal discharge, erythema or tenderness.      Right Adnexa: not tender and no mass present.    Left Adnexa: not tender and no mass present.    No cervical motion tenderness, friability or polyp.     Uterus is not enlarged or tender.  Breasts:    Right: No mass, nipple discharge, skin change or tenderness.     Left: No mass, nipple discharge, skin change or tenderness.  Neck:     Thyroid: No thyromegaly.  Cardiovascular:     Rate and Rhythm: Normal rate and regular rhythm.     Heart sounds: Normal heart sounds. No murmur heard. Pulmonary:     Effort: Pulmonary effort is normal.     Breath sounds: Normal breath sounds.  Abdominal:     Palpations: Abdomen is soft.     Tenderness: There is no abdominal tenderness. There is no  guarding or rebound.  Musculoskeletal:        General: Normal range of motion.     Cervical back: Normal range of motion.  Lymphadenopathy:     Cervical: No cervical adenopathy.  Neurological:     General: No focal deficit present.     Mental Status: She is alert and oriented to person, place, and time.     Cranial Nerves: No cranial nerve deficit.  Skin:    General: Skin is warm and  dry.  Psychiatric:        Mood and Affect: Mood normal.        Behavior: Behavior normal.        Thought Content: Thought content normal.        Judgment: Judgment normal.  Vitals reviewed.    Assessment/Plan:  Encounter for annual routine gynecological examination  Cervical cancer screening - Plan: Cytology - PAP  Screening for HPV (human papillomavirus) - Plan: Cytology - PAP  Encounter for screening mammogram for malignant neoplasm of breast - Plan: MM 3D SCREEN BREAST BILATERAL; pt to sched mammo  Postmenopausal atrophic vaginitis - Plan: estradiol (ESTRACE) 0.1 MG/GM vaginal cream; Rx RF. F/u prn.   Dyspareunia in female - Plan: estradiol (ESTRACE) 0.1 MG/GM vaginal cream    Meds ordered this encounter  Medications   estradiol (ESTRACE) 0.1 MG/GM vaginal cream    Sig: Insert 1g once weekly as maintenance    Dispense:  42.5 g    Refill:  1    Order Specific Question:   Supervising Provider    Answer:   Gae Dry [[id number]]            GYN counsel breast self exam, mammography screening, menopause, adequate intake of calcium and vitamin D, diet and exercise    F/U  Return in about 1 year (around 02/17/2022).  Rukiya Hodgkins B. Ariella Voit, PA-C 02/17/2021 1:54 PM

## 2021-02-17 NOTE — Patient Instructions (Signed)
I value your feedback and you entrusting us with your care. If you get a [facility] patient survey, I would appreciate you taking the time to let us know about your experience today. Thank you!  [facility]: [phone]      

## 2021-02-19 LAB — CYTOLOGY - PAP
Comment: NEGATIVE
Diagnosis: NEGATIVE
High risk HPV: NEGATIVE

## 2021-03-25 ENCOUNTER — Ambulatory Visit
Admission: RE | Admit: 2021-03-25 | Discharge: 2021-03-25 | Disposition: A | Discharge status: Home / Self Care | Payer: Managed Care, Other (non HMO) | Source: Ambulatory Visit | Attending: Obstetrics and Gynecology | Admitting: Obstetrics and Gynecology

## 2021-03-25 ENCOUNTER — Other Ambulatory Visit: Payer: Self-pay

## 2021-03-25 DIAGNOSIS — Z1231 Encounter for screening mammogram for malignant neoplasm of breast: Secondary | ICD-10-CM | POA: Insufficient documentation

## 2021-07-24 ENCOUNTER — Other Ambulatory Visit: Payer: Self-pay | Admitting: Unknown Physician Specialty

## 2021-07-24 DIAGNOSIS — F418 Other specified anxiety disorders: Secondary | ICD-10-CM

## 2021-07-24 DIAGNOSIS — F411 Generalized anxiety disorder: Secondary | ICD-10-CM

## 2021-07-25 NOTE — Telephone Encounter (Signed)
Called to schedule pt with a new provider. Pt has seen only Dr. Dorris Fetch and saw Gabriel Cirri NP.  Pt has only 7 pills left. Advised pt to call this week.  Last RF 12/17/20 #90 1 RF  Requested Prescriptions  Pending Prescriptions Disp Refills   escitalopram (LEXAPRO) 10 MG tablet [Pharmacy Med Name: ESCITALOPRAM 10 MG TABLET] 30 tablet 5    Sig: TAKE 1/2 TABLET DAILY FOR THE FIRST WEEK THEN TAKE 1 TABLET (10 MG TOTAL) BY MOUTH DAILY.     Psychiatry:  Antidepressants - SSRI Failed - 07/24/2021  1:28 PM      Failed - Valid encounter within last 6 months    Recent Outpatient Visits           7 months ago Depression with anxiety   [facility] Gabriel Cirri, NP   1 year ago Essential hypertension   [facility] Jamelle Haring, MD   1 year ago Essential hypertension   [facility] Jamelle Haring, MD   1 year ago Essential hypertension   [facility] Jamelle Haring, MD   1 year ago Essential hypertension   [facility] Jamelle Haring, MD              Passed - Completed PHQ-2 or PHQ-9 in the last 360 days       Used Dr Carlynn Purl as a co-signer.

## 2021-07-26 ENCOUNTER — Other Ambulatory Visit: Payer: Self-pay | Admitting: Physician Assistant

## 2021-07-26 DIAGNOSIS — F418 Other specified anxiety disorders: Secondary | ICD-10-CM

## 2021-07-26 DIAGNOSIS — F411 Generalized anxiety disorder: Secondary | ICD-10-CM

## 2021-07-26 MED ORDER — ESCITALOPRAM OXALATE 10 MG PO TABS: 10.0000 mg | ORAL_TABLET | Freq: Every day | ORAL | 0 refills | Status: DC

## 2021-07-26 NOTE — Progress Notes (Signed)
Provided courtesy refill for 3 months to allow patient time to schedule follow up in office. She will need to schedule an apt for further medication management and to establish care with new PCP here.

## 2021-07-27 ENCOUNTER — Other Ambulatory Visit: Payer: Self-pay | Admitting: Unknown Physician Specialty

## 2021-07-27 DIAGNOSIS — F411 Generalized anxiety disorder: Secondary | ICD-10-CM

## 2021-07-27 DIAGNOSIS — F418 Other specified anxiety disorders: Secondary | ICD-10-CM

## 2021-08-06 ENCOUNTER — Telehealth (INDEPENDENT_AMBULATORY_CARE_PROVIDER_SITE_OTHER): Payer: Managed Care, Other (non HMO) | Admitting: Nurse Practitioner

## 2021-08-06 ENCOUNTER — Other Ambulatory Visit: Payer: Self-pay

## 2021-08-06 DIAGNOSIS — F329 Major depressive disorder, single episode, unspecified: Secondary | ICD-10-CM | POA: Diagnosis not present

## 2021-08-06 DIAGNOSIS — F411 Generalized anxiety disorder: Secondary | ICD-10-CM

## 2021-08-06 MED ORDER — ESCITALOPRAM OXALATE 10 MG PO TABS: 10.0000 mg | ORAL_TABLET | Freq: Every day | ORAL | 1 refills | Status: DC

## 2021-08-06 MED ORDER — HYDROXYZINE HCL 10 MG PO TABS: 10.0000 mg | ORAL_TABLET | Freq: Two times a day (BID) | ORAL | 1 refills | Status: DC | PRN

## 2021-08-06 NOTE — Progress Notes (Signed)
Name: Lauren Vasquez   MRN: [id number]    DOB: Jul 29, 1960   Date:08/06/2021       Progress Note  Subjective  Chief Complaint  Chief Complaint  Patient presents with   Depression   Anxiety    I connected with  Leane Platt  on 08/06/21 at 0920 am by a video enabled telemedicine application and verified that I am speaking with the correct person using two identifiers.  I discussed the limitations of evaluation and management by telemedicine and the availability of in person appointments. The patient expressed understanding and agreed to proceed with a virtual visit  Staff also discussed with the patient that there may be a patient responsible charge related to this service. Patient Location: home Provider Location: [facility] Additional Individuals present: alone  HPI  Depression/anxiety:Her PHQ9 and GAD scores are positive.  She is currently taking Lexapro 10 mg daily.  Hydroxyzine 10 mg BID as needed. She says she only takes that every once in awhile.  She says it has been a rough couple of years with taking care of her Dad, she is considering placing him in a nursing home. She also cares for her two grandchildren. She denies any suicidal thoughts. She does say that she is ready to do counseling.  She would like a list of local counselors.  She says that the lexapro really does help.  Will continue current dose and send refills.   Depression screen [facility] 2/9 08/06/2021 12/17/2020 07/07/2020 02/25/2020 01/21/2020  Decreased Interest 1 0 1 0 -  Down, Depressed, Hopeless 1 0 1 1 1   PHQ - 2 Score 2 0 2 1 1   Altered sleeping 2 0 2 2 1   Tired, decreased energy 0 0 3 1 0  Change in appetite 0 0 0 0 0  Feeling bad or failure about yourself  1 0 0 1 1  Trouble concentrating 1 0 0 0 1  Moving slowly or fidgety/restless 1 0 0 0 0  Suicidal thoughts 0 0 0 0 0  PHQ-9 Score 7 0 7 5 4   Difficult doing work/chores Not difficult at all Not difficult at all Somewhat difficult Somewhat difficult Somewhat  difficult    GAD 7 : Generalized Anxiety Score 08/06/2021 12/17/2020 07/07/2020 10/16/2019  Nervous, Anxious, on Edge 1 0 3 2  Control/stop worrying 1 0 1 1  Worry too much - different things 1 0 1 0  Trouble relaxing 1 0 3 0  Restless 1 0 1 0  Easily annoyed or irritable 2 0 2 1  Afraid - awful might happen 1 0 1 1  Total GAD 7 Score 8 0 12 5  Anxiety Difficulty Somewhat difficult Not difficult at all Somewhat difficult Somewhat difficult      Patient Active Problem List   Diagnosis Date Noted   Dyspareunia in female 02/17/2021   Generalized anxiety disorder 01/21/2020   Class 1 obesity due to excess calories with serious comorbidity and body mass index (BMI) of 33.0 to 33.9 in adult 10/16/2019   Mixed hyperlipidemia 10/16/2019   Polyp of colon 10/16/2019   Seasonal allergic rhinitis due to pollen 10/16/2019   FH: colon cancer 10/16/2019   Depression with anxiety 10/16/2019   Prediabetes 04/09/2018   Asthma 04/09/2018   Postmenopausal atrophic vaginitis 04/09/2018   Acute foot pain 04/09/2018   Essential hypertension 10/30/2017    Social History   Tobacco Use   Smoking status: Former   Smokeless tobacco: Never   Tobacco  comments:    quit ~ 2000  Substance Use Topics   Alcohol use: Yes    Comment: ocasionally      Current Outpatient Medications:    albuterol (VENTOLIN HFA) 108 (90 Base) MCG/ACT inhaler, Inhale into the lungs every 4 (four) hours as needed. , Disp: , Rfl:    escitalopram (LEXAPRO) 10 MG tablet, Take 1 tablet (10 mg total) by mouth daily. Take 1/2 tablet for first week., Disp: 90 tablet, Rfl: 0   estradiol (ESTRACE) 0.1 MG/GM vaginal cream, Insert 1g once weekly as maintenance, Disp: 42.5 g, Rfl: 1   hydrOXYzine (ATARAX/VISTARIL) 10 MG tablet, Take 1 tablet (10 mg total) by mouth 2 (two) times daily as needed (may make drowsy)., Disp: 60 tablet, Rfl: 1   lisinopril (ZESTRIL) 10 MG tablet, Take 1 tablet (10 mg total) by mouth daily., Disp: 90 tablet, Rfl:  3   RETIN-A MICRO PUMP 0.08 % GEL, Apply topically at bedtime., Disp: , Rfl:   Allergies  Allergen Reactions   Shrimp [Shellfish Allergy]    Sulfites     I personally reviewed active problem list, medication list, allergies, notes from last encounter with the patient/caregiver today.  ROS  Constitutional: Negative for fever or weight change.  Respiratory: Negative for cough and shortness of breath.   Cardiovascular: Negative for chest pain or palpitations.  Gastrointestinal: Negative for abdominal pain, no bowel changes.  Musculoskeletal: Negative for gait problem or joint swelling.  Skin: Negative for rash.  Neurological: Negative for dizziness or headache.  No other specific complaints in a complete review of systems (except as listed in HPI above).   Objective  Virtual encounter, vitals not obtained.  There is no height or weight on file to calculate BMI.  Nursing Note and Vital Signs reviewed.  Physical Exam  Awake, alert and oriented, speaking in complete sentences  No results found for this or any previous visit (from the past 72 hour(s)).  Assessment & Plan  1. Major depressive disorder with current active episode, moderate depression episode severity, recurrent - reach out for counseling - escitalopram (LEXAPRO) 10 MG tablet; Take 1 tablet (10 mg total) by mouth daily. Take 1/2 tablet for first week.  Dispense: 90 tablet; Refill: 1  2. Generalized anxiety disorder -reach out for counseling - escitalopram (LEXAPRO) 10 MG tablet; Take 1 tablet (10 mg total) by mouth daily. Take 1/2 tablet for first week.  Dispense: 90 tablet; Refill: 1 - hydrOXYzine (ATARAX) 10 MG tablet; Take 1 tablet (10 mg total) by mouth 2 (two) times daily as needed (may make drowsy).  Dispense: 60 tablet; Refill: 1   List of counseling services locally: [place] [phone], [place] [phone]  Another thing you can do is contact your insurance  provider and ask them if there are counselors in your area covered by your insurance.   -Red flags and when to present for emergency care or RTC including fever >101.27F, chest pain, shortness of breath, new/worsening/un-resolving symptoms,  reviewed with patient at time of visit. Follow up and care instructions discussed and provided in AVS. - I discussed the assessment and treatment plan with the patient. The patient was provided an opportunity to ask questions and all were answered. The patient agreed with the plan and demonstrated an understanding of the instructions.  I provided 15 minutes of non-face-to-face time during this encounter.  Bo Merino, FNP

## 2021-08-28 ENCOUNTER — Other Ambulatory Visit: Payer: Self-pay | Admitting: Nurse Practitioner

## 2021-08-28 DIAGNOSIS — F411 Generalized anxiety disorder: Secondary | ICD-10-CM

## 2021-08-30 NOTE — Telephone Encounter (Signed)
Requested medications are due for refill today.  Unsure - seems too soon ? ?Requested medications are on the active medications list.  yes ? ?Last refill. 08/06/2021 #60 1 refill ? ?Future visit scheduled.   no ? ?Notes to clinic.  Pharmacy needs a Dx code. Hendricks listed as PCP. Refill seems too soon. ? ? ? ?Requested Prescriptions  ?Pending Prescriptions Disp Refills  ? hydrOXYzine (ATARAX) 10 MG tablet [Pharmacy Med Name: HYDROXYZINE HCL 10 MG TABLET] 180 tablet 1  ?  Sig: Take 1 tablet (10 mg total) by mouth 2 (two) times daily as needed (may make drowsy).  ?  ? Ear, Nose, and Throat:  Antihistamines 2 Passed - 08/28/2021  1:35 PM  ?  ?  Passed - Cr in normal range and within 360 days  ?  Creat  ?Date Value Ref Range Status  ?12/17/2020 0.61 0.50 - 1.05 mg/dL Final  ?  Comment:  ?  For patients >62 years of age, the reference limit ?for Creatinine is approximately 13% higher for people ?identified as African-American. ?. ?  ?  ?  ?  ?  Passed - Valid encounter within last 12 months  ?  Recent Outpatient Visits   ? ?      ? 3 weeks ago Major depressive disorder with current active episode, moderate depression episode severity, recurrent  ? [facility] Berniece Salines, FNP  ? 8 months ago Depression with anxiety  ? [facility] Gabriel Cirri, NP  ? 1 year ago Essential hypertension  ? [facility] Welford Roche D, MD  ? 1 year ago Essential hypertension  ? [facility] Welford Roche D, MD  ? 1 year ago Essential hypertension  ? [facility] Welford Roche D, MD  ? ?  ?  ? ?  ?  ?  ?  ?

## 2021-10-25 ENCOUNTER — Ambulatory Visit (LOCAL_COMMUNITY_HEALTH_CENTER): Payer: Managed Care, Other (non HMO)

## 2021-10-25 DIAGNOSIS — Z719 Counseling, unspecified: Secondary | ICD-10-CM

## 2021-10-25 DIAGNOSIS — Z23 Encounter for immunization: Secondary | ICD-10-CM

## 2021-10-25 NOTE — Progress Notes (Signed)
?  Are you feeling sick today? No ? ? ?Have you ever received a dose of COVID-19 Vaccine? [employer], [place], [place], [place], Other) Yes ? ?If yes, which vaccine and how many doses?   [place] ? ? ?Did you bring the vaccination record card or other documentation?  Yes ? ? ?Do you have a health condition or are undergoing treatment that makes you moderately or severely immunocompromised? This would include, but not be limited to: cancer, HIV, organ transplant, immunosuppressive therapy/high-dose corticosteroids, or moderate/severe primary immunodeficiency.  No ? ?Have you received COVID-19 vaccine before or during hematopoietic cell transplant (HCT) or CAR-T-cell therapies? No ? ?Have you ever had an allergic reaction to: (This would include a severe allergic reaction or a reaction that caused hives, swelling, or respiratory distress, including wheezing.) A component of a COVID-19 vaccine or a previous dose of COVID-19 vaccine? No ? ? ?Have you ever had an allergic reaction to another vaccine (other thanCOVID-19 vaccine) or an injectable medication? (This would include a severe allergic reaction or a reaction that caused hives, swelling, or respiratory distress, including wheezing.)   No ?  ?Do you have a history of any of the following: ? ?Myocarditis or Pericarditis No ?Thrombosis with thrombocytopenia syndrome (TTS) No ?Multisystem Inflammatory Syndrome (MIS-C or MIS-A)? No ?Immune-mediate syndrome defined by thrombosis and thrombocytopenia, such as heparin--induced thrombocytopenia (HIT)  No ?Guillain-Barr? Syndrome (GBS) No ?COVID-19 disease within the past 3 months? No ?Vaccinated with monkeypox vaccine in the last 4 weeks? No ? ?Patient seen for COVID booster vaccination.  Pfizer Bivalent administered in left deltoid.  Tolerated well.  Covid card updated and [facility] updated and copy of [facility] provided. ?  ?

## 2022-02-04 ENCOUNTER — Ambulatory Visit: Payer: Self-pay | Admitting: *Deleted

## 2022-02-04 ENCOUNTER — Other Ambulatory Visit: Payer: Self-pay | Admitting: Unknown Physician Specialty

## 2022-02-04 ENCOUNTER — Encounter: Payer: Self-pay | Admitting: Nurse Practitioner

## 2022-02-04 DIAGNOSIS — I10 Essential (primary) hypertension: Secondary | ICD-10-CM

## 2022-02-04 NOTE — Telephone Encounter (Signed)
Patient requesting refill via mychart for lisinopril   Patient inquiring if medication is not refill today if it's ok to wait until next week to take it   Please fu w/ patient  Reason for Disposition  [1] Caller requesting a prescription renewal (no refills left), no triage required, AND [2] triager able to renew prescription per department policy  Answer Assessment - Initial Assessment Questions 1. DRUG NAME: "What medicine do you need to have refilled?"     Lisinopril 2. REFILLS REMAINING: "How many refills are remaining?" (Note: The label on the medicine or pill bottle will show how many refills are remaining. If there are no refills remaining, then a renewal may be needed.)     none 3. EXPIRATION DATE: "What is the expiration date?" (Note: The label states when the prescription will expire, and thus can no longer be refilled.)       4. PRESCRIBING HCP: "Who prescribed it?" Reason: If prescribed by specialist, call should be referred to that group.     PCP Patient called regarding BP medication- refill requested Patient notified:   Disp Refills Start End  lisinopril (ZESTRIL) 10 MG tablet 30 tablet 11 02/04/2022   Sig - Route: TAKE 1 TABLET BY MOUTH EVERY DAY - Oral  Sent to pharmacy as: lisinopril (ZESTRIL) 10 MG tablet  Notes to Pharmacy: DX Code Needed  .  E-Prescribing Status: Receipt confirmed by pharmacy (02/04/2022  9:24 AM EDT)  Renewals   Renewal provider: Gabriel Cirri, NP  Should be ready to pick up today  Protocols used: Medication Refill and Renewal Call-A-AH

## 2022-03-03 ENCOUNTER — Other Ambulatory Visit: Payer: Self-pay | Admitting: Nurse Practitioner

## 2022-03-03 DIAGNOSIS — F329 Major depressive disorder, single episode, unspecified: Secondary | ICD-10-CM

## 2022-03-03 DIAGNOSIS — F411 Generalized anxiety disorder: Secondary | ICD-10-CM

## 2022-03-03 NOTE — Telephone Encounter (Signed)
Attempted to call patient to schedule appointment- left message to call office- courtesy 30 day Rx given Requested Prescriptions  Pending Prescriptions Disp Refills  . escitalopram (LEXAPRO) 10 MG tablet [Pharmacy Med Name: ESCITALOPRAM 10 MG TABLET] 30 tablet 0    Sig: TAKE 1 TABLET (10 MG TOTAL) BY MOUTH DAILY. TAKE 1/2 TABLET FOR FIRST WEEK.     Psychiatry:  Antidepressants - SSRI Failed - 03/03/2022  2:10 AM      Failed - Valid encounter within last 6 months    Recent Outpatient Visits          6 months ago Major depressive disorder with current active episode, moderate depression episode severity, recurrent   [facility] Berniece Salines, FNP   1 year ago Depression with anxiety   [facility] Gabriel Cirri, NP   1 year ago Essential hypertension   [facility] Jamelle Haring, MD   2 years ago Essential hypertension   [facility] Jamelle Haring, MD   2 years ago Essential hypertension   [facility] Jamelle Haring, MD             Passed - Completed PHQ-2 or PHQ-9 in the last 360 days      . hydrOXYzine (ATARAX) 10 MG tablet [Pharmacy Med Name: HYDROXYZINE HCL 10 MG TABLET] 60 tablet 5    Sig: TAKE 1 TABLET (10 MG TOTAL) BY MOUTH 2 (TWO) TIMES DAILY AS NEEDED (MAY MAKE DROWSY).     Ear, Nose, and Throat:  Antihistamines 2 Failed - 03/03/2022  2:10 AM      Failed - Cr in normal range and within 360 days    Creat  Date Value Ref Range Status  12/17/2020 0.61 0.50 - 1.05 mg/dL Final    Comment:    For patients >8 years of age, the reference limit for Creatinine is approximately 13% higher for people identified as African-American. Verna Czech - Valid encounter within last 12 months    Recent Outpatient Visits          6 months ago Major depressive disorder with current active episode, moderate depression episode severity, recurrent   [facility] Berniece Salines, FNP   1 year ago Depression with anxiety   [facility] Gabriel Cirri, NP   1 year ago Essential hypertension   [facility] Jamelle Haring, MD   2 years ago Essential hypertension   [facility] Jamelle Haring, MD   2 years ago Essential hypertension   [facility] Jamelle Haring, MD

## 2022-03-28 ENCOUNTER — Other Ambulatory Visit: Payer: Self-pay | Admitting: Nurse Practitioner

## 2022-03-28 DIAGNOSIS — F411 Generalized anxiety disorder: Secondary | ICD-10-CM

## 2022-03-28 DIAGNOSIS — F329 Major depressive disorder, single episode, unspecified: Secondary | ICD-10-CM

## 2022-03-29 NOTE — Telephone Encounter (Signed)
Requested medication (s) are due for refill today: review  Requested medication (s) are on the active medication list: yes  Last refill:  lexapro 03/03/22 #30/0, hydroxyzine 03/03/22 #60/0  Future visit scheduled: no  Notes to clinic:  pharmacy requesting 90 DS, please advise     Requested Prescriptions  Pending Prescriptions Disp Refills   escitalopram (LEXAPRO) 10 MG tablet [Pharmacy Med Name: ESCITALOPRAM 10 MG TABLET] 90 tablet 1    Sig: TAKE 1/2 TABLET FOR FIRST WEEK. THEN 1 TABLET BY MOUTH EVERY DAY     Psychiatry:  Antidepressants - SSRI Failed - 03/28/2022  1:32 PM      Failed - Valid encounter within last 6 months    Recent Outpatient Visits           7 months ago Major depressive disorder with current active episode, moderate depression episode severity, recurrent   [facility] Bo Merino, FNP   1 year ago Depression with anxiety   [facility] Kathrine Haddock, NP   1 year ago Essential hypertension   [facility] Towanda Malkin, MD   2 years ago Essential hypertension   [facility] Towanda Malkin, MD   2 years ago Essential hypertension   [place], MD              Passed - Completed PHQ-2 or PHQ-9 in the last 360 days       hydrOXYzine (ATARAX) 10 MG tablet [Pharmacy Med Name: HYDROXYZINE HCL 10 MG TABLET] 180 tablet 1    Sig: TAKE 1 TABLET (10 MG TOTAL) BY MOUTH 2 (TWO) TIMES DAILY AS NEEDED (MAY MAKE DROWSY).     Ear, Nose, and Throat:  Antihistamines 2 Failed - 03/28/2022  1:32 PM      Failed - Cr in normal range and within 360 days    Creat  Date Value Ref Range Status  12/17/2020 0.61 0.50 - 1.05 mg/dL Final    Comment:    For patients >4 years of age, the reference limit for Creatinine is approximately 13% higher for people identified as African-American. Renella Cunas - Valid encounter within last  12 months    Recent Outpatient Visits           7 months ago Major depressive disorder with current active episode, moderate depression episode severity, recurrent   [facility] Bo Merino, FNP   1 year ago Depression with anxiety   [facility] Kathrine Haddock, NP   1 year ago Essential hypertension   [facility] Towanda Malkin, MD   2 years ago Essential hypertension   [facility] Towanda Malkin, MD   2 years ago Essential hypertension   [facility] Towanda Malkin, MD

## 2022-04-20 NOTE — Progress Notes (Unsigned)
PCP: Bo Merino, FNP   No chief complaint on file.    HPI:      Lauren Vasquez is a 61 y.o. [phone] who LMP was No LMP recorded. Patient is postmenopausal., presents today for her annual examination.  Her menses are absent due to menopause. She does not have PMB. She does have infrequent vasomotor sx.   Sex activity: single partner, contraception - post menopausal status. She does have vaginal dryness and uses lubricants and estrace crm 1 g wkly with some sx improvement.   Last Pap: 02/17/21  Results were: no abnormalities /neg HPV DNA.  Hx of STDs: none  Last mammogram: 03/25/21  Results were: normal--routine follow-up in 12 months There is no FH of breast cancer. There is a FH of ovarian cancer in her PGM and colon cancer in her father. Pt is MyRisk neg 2016.  The patient does self-breast exams.  Colonoscopy: colonoscopy 2018 without abnormalities with Dr. Vira Agar.  Repeat due after 5 years due to hx of polyps in past/FH colon cancer.  Tobacco use: The patient denies current or previous tobacco use. Alcohol use: few drinks wkly No drug use  Exercise: moderately active  She does get adequate calcium and Vitamin D in her diet.  Labs with PCP 7/21 and meds with PCP.  Past Medical History:  Diagnosis Date   Allergic rhinitis    Asthma    BRCA negative 2016   MyRisk neg   Essential hypertension    Family history of ovarian cancer 2016   MyRisk neg   Pre-diabetes 2019    Past Surgical History:  Procedure Laterality Date   CESAREAN SECTION  1991/1992   COLONOSCOPY  2018   [facility], repeat after 5 yrs   LYMPHADENECTOMY  1985   TMJ ARTHROPLASTY  1989    Family History  Problem Relation Age of Onset   Prostate cancer Father    Skin cancer Father    Colon cancer Father    Hypertension Father    Skin cancer Brother    Lung cancer Maternal Grandfather    Ovarian cancer Paternal Grandmother    Hypertension Mother    Hypertension Sister    Breast cancer Neg Hx      Social History   Socioeconomic History   Marital status: Married    Spouse name: Not on file   Number of children: Not on file   Years of education: Not on file   Highest education level: Not on file  Occupational History   Not on file  Tobacco Use   Smoking status: Former   Smokeless tobacco: Never   Tobacco comments:    quit ~ 2000  Vaping Use   Vaping Use: Never used  Substance and Sexual Activity   Alcohol use: Yes    Comment: ocasionally    Drug use: Never   Sexual activity: Yes    Birth control/protection: Post-menopausal  Other Topics Concern   Not on file  Social History Narrative   Married.   2 children.    Lives in [place].    Enjoys traveling.    Social Determinants of Health   Financial Resource Strain: Not on file  Food Insecurity: Not on file  Transportation Needs: Not on file  Physical Activity: Not on file  Stress: Not on file  Social Connections: Not on file  Intimate Partner Violence: Not on file    Outpatient Medications Prior to Visit  Medication Sig Dispense Refill   albuterol (  VENTOLIN HFA) 108 (90 Base) MCG/ACT inhaler Inhale into the lungs every 4 (four) hours as needed.      escitalopram (LEXAPRO) 10 MG tablet Take 1 tablet (10 mg total) by mouth daily. 90 tablet 1   estradiol (ESTRACE) 0.1 MG/GM vaginal cream Insert 1g once weekly as maintenance 42.5 g 1   hydrOXYzine (ATARAX) 10 MG tablet TAKE 1 TABLET (10 MG TOTAL) BY MOUTH 2 (TWO) TIMES DAILY AS NEEDED (MAY MAKE DROWSY). 180 tablet 1   lisinopril (ZESTRIL) 10 MG tablet TAKE 1 TABLET BY MOUTH EVERY DAY 30 tablet 11   RETIN-A MICRO PUMP 0.08 % GEL Apply topically at bedtime.     No facility-administered medications prior to visit.    ROS:  Review of Systems  Constitutional:  Negative for fatigue, fever and unexpected weight change.  Respiratory:  Negative for cough, shortness of breath and wheezing.   Cardiovascular:  Negative for chest pain, palpitations and leg swelling.   Gastrointestinal:  Negative for blood in stool, constipation, diarrhea, nausea and vomiting.  Endocrine: Negative for cold intolerance, heat intolerance and polyuria.  Genitourinary:  Negative for dyspareunia, dysuria, flank pain, frequency, genital sores, hematuria, menstrual problem, pelvic pain, urgency, vaginal bleeding, vaginal discharge and vaginal pain.  Musculoskeletal:  Negative for back pain, joint swelling and myalgias.  Skin:  Negative for rash.  Neurological:  Negative for dizziness, syncope, light-headedness, numbness and headaches.  Hematological:  Negative for adenopathy.  Psychiatric/Behavioral:  Positive for agitation. Negative for confusion, sleep disturbance and suicidal ideas. The patient is not nervous/anxious.   BREAST: No symptoms    Objective: There were no vitals taken for this visit.   Physical Exam Constitutional:      Appearance: She is well-developed.  Genitourinary:     Vulva normal.     Right Labia: No rash, tenderness or lesions.    Left Labia: No tenderness, lesions or rash.    No vaginal discharge, erythema or tenderness.      Right Adnexa: not tender and no mass present.    Left Adnexa: not tender and no mass present.    No cervical motion tenderness, friability or polyp.     Uterus is not enlarged or tender.  Breasts:    Right: No mass, nipple discharge, skin change or tenderness.     Left: No mass, nipple discharge, skin change or tenderness.  Neck:     Thyroid: No thyromegaly.  Cardiovascular:     Rate and Rhythm: Normal rate and regular rhythm.     Heart sounds: Normal heart sounds. No murmur heard. Pulmonary:     Effort: Pulmonary effort is normal.     Breath sounds: Normal breath sounds.  Abdominal:     Palpations: Abdomen is soft.     Tenderness: There is no abdominal tenderness. There is no guarding or rebound.  Musculoskeletal:        General: Normal range of motion.     Cervical back: Normal range of motion.   Lymphadenopathy:     Cervical: No cervical adenopathy.  Neurological:     General: No focal deficit present.     Mental Status: She is alert and oriented to person, place, and time.     Cranial Nerves: No cranial nerve deficit.  Skin:    General: Skin is warm and dry.  Psychiatric:        Mood and Affect: Mood normal.        Behavior: Behavior normal.  Thought Content: Thought content normal.        Judgment: Judgment normal.  Vitals reviewed.     Assessment/Plan:  Encounter for annual routine gynecological examination  Cervical cancer screening - Plan: Cytology - PAP  Screening for HPV (human papillomavirus) - Plan: Cytology - PAP  Encounter for screening mammogram for malignant neoplasm of breast - Plan: MM 3D SCREEN BREAST BILATERAL; pt to sched mammo  Postmenopausal atrophic vaginitis - Plan: estradiol (ESTRACE) 0.1 MG/GM vaginal cream; Rx RF. F/u prn.   Dyspareunia in female - Plan: estradiol (ESTRACE) 0.1 MG/GM vaginal cream    No orders of the defined types were placed in this encounter.           GYN counsel breast self exam, mammography screening, menopause, adequate intake of calcium and vitamin D, diet and exercise    F/U  No follow-ups on file.  Jayde Mcallister B. Geza Beranek, PA-C 04/20/2022 9:29 PM

## 2022-04-21 ENCOUNTER — Ambulatory Visit (INDEPENDENT_AMBULATORY_CARE_PROVIDER_SITE_OTHER): Payer: Managed Care, Other (non HMO) | Admitting: Obstetrics and Gynecology

## 2022-04-21 ENCOUNTER — Encounter: Payer: Self-pay | Admitting: Obstetrics and Gynecology

## 2022-04-21 VITALS — BP 120/70 | Ht 63.0 in | Wt 202.0 lb

## 2022-04-21 DIAGNOSIS — N941 Unspecified dyspareunia: Secondary | ICD-10-CM | POA: Diagnosis not present

## 2022-04-21 DIAGNOSIS — Z1211 Encounter for screening for malignant neoplasm of colon: Secondary | ICD-10-CM

## 2022-04-21 DIAGNOSIS — N952 Postmenopausal atrophic vaginitis: Secondary | ICD-10-CM

## 2022-04-21 DIAGNOSIS — Z01411 Encounter for gynecological examination (general) (routine) with abnormal findings: Secondary | ICD-10-CM | POA: Diagnosis not present

## 2022-04-21 DIAGNOSIS — Z01419 Encounter for gynecological examination (general) (routine) without abnormal findings: Secondary | ICD-10-CM

## 2022-04-21 DIAGNOSIS — Z1231 Encounter for screening mammogram for malignant neoplasm of breast: Secondary | ICD-10-CM

## 2022-04-21 MED ORDER — ESTRADIOL 0.1 MG/GM VA CREA: TOPICAL_CREAM | VAGINAL | 1 refills | Status: DC

## 2022-04-21 NOTE — Patient Instructions (Signed)
I value your feedback and you entrusting us with your care. If you get a [facility] patient survey, I would appreciate you taking the time to let us know about your experience today. Thank you!  [facility]: [phone]      

## 2022-05-26 IMAGING — MG MM DIGITAL SCREENING BILAT W/ TOMO AND CAD
8 series · 8 of 24 positions shown · non-contrast
Comparison: Previous exam(s).

CLINICAL DATA: Screening.

EXAM:
DIGITAL SCREENING BILATERAL MAMMOGRAM WITH TOMOSYNTHESIS AND CAD
TECHNIQUE: Bilateral screening digital craniocaudal and mediolateral oblique
mammograms were obtained. Bilateral screening digital breast
tomosynthesis was performed. The images were evaluated with
computer-aided detection.

[L CC synth-2D]
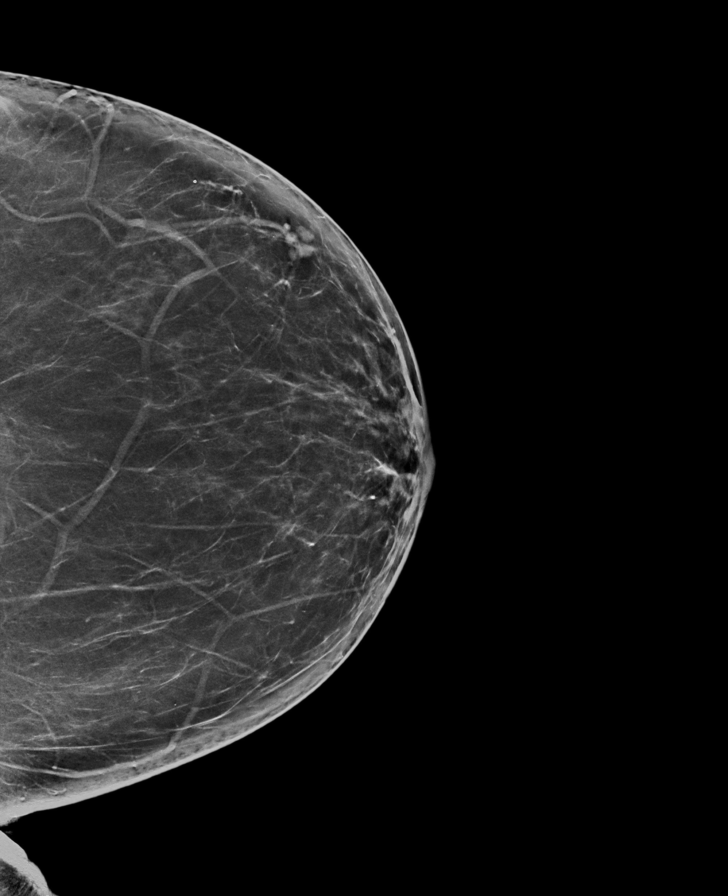

[R CC synth-2D]
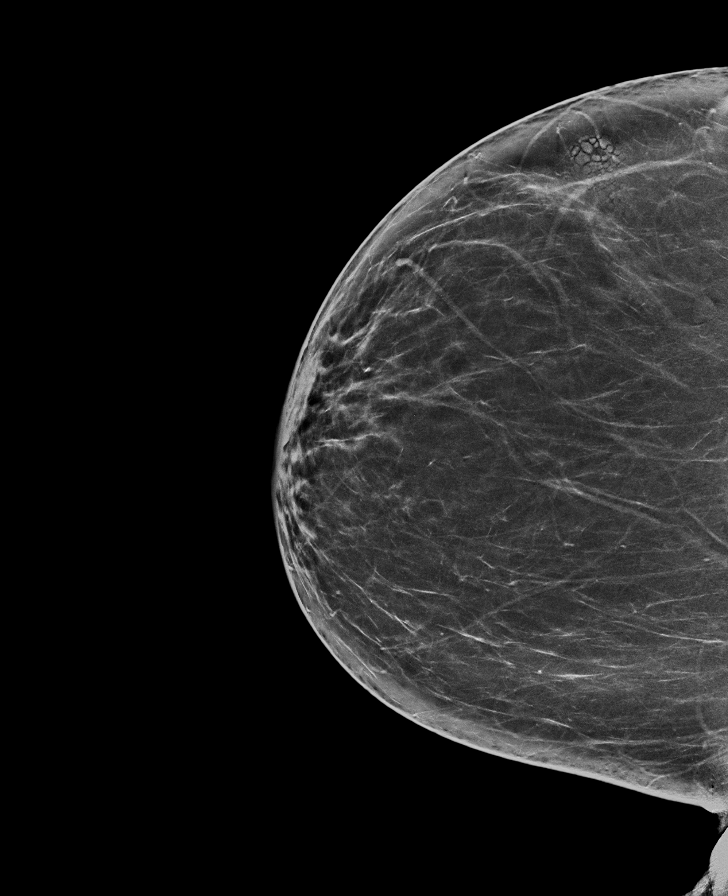

[R MLO synth-2D]
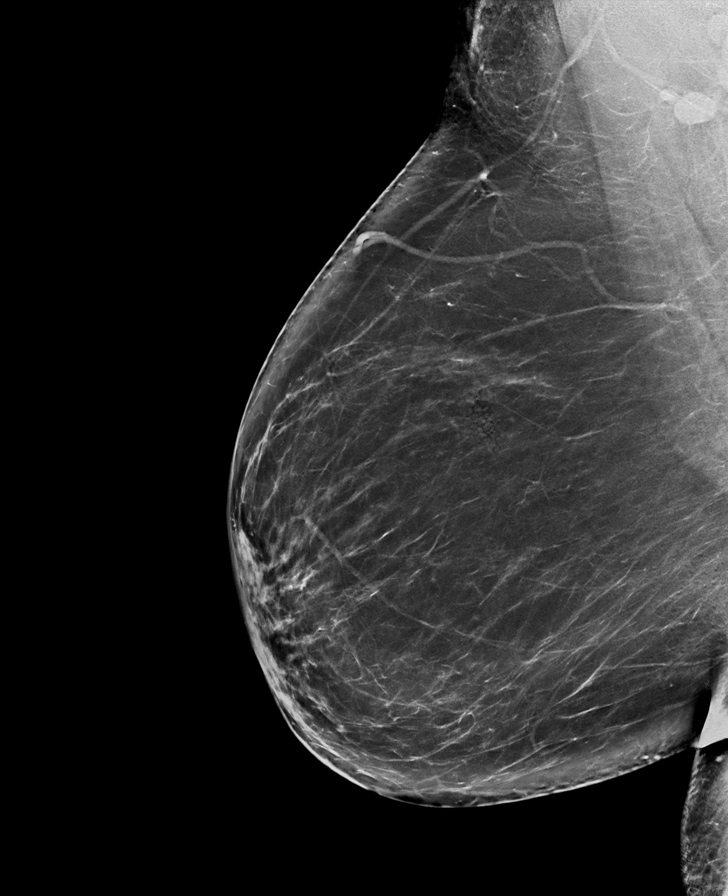

[L MLO synth-2D]
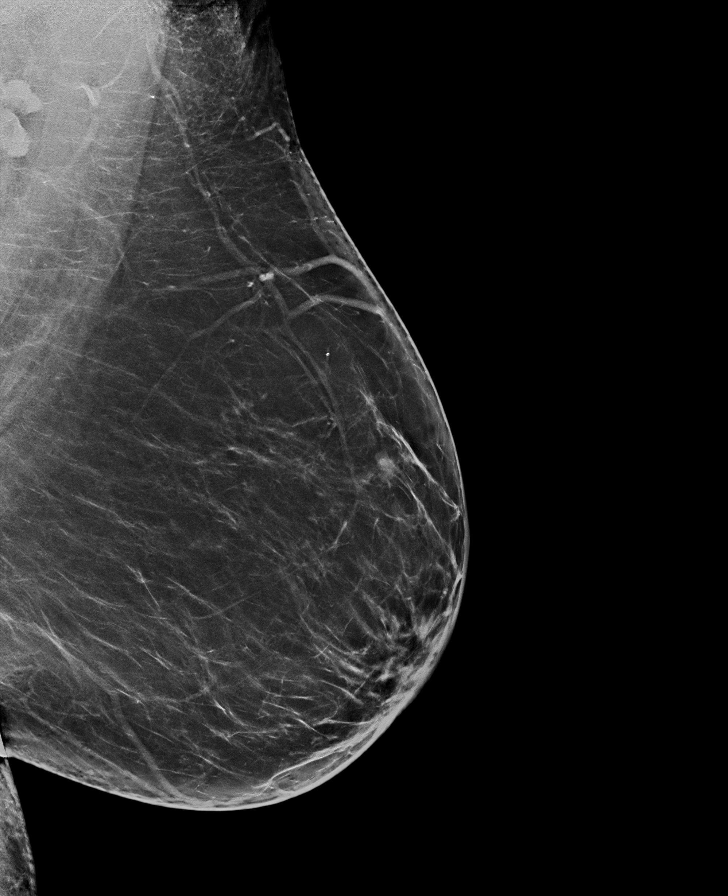

[L CC tomo · tomo slice 39/77.0]
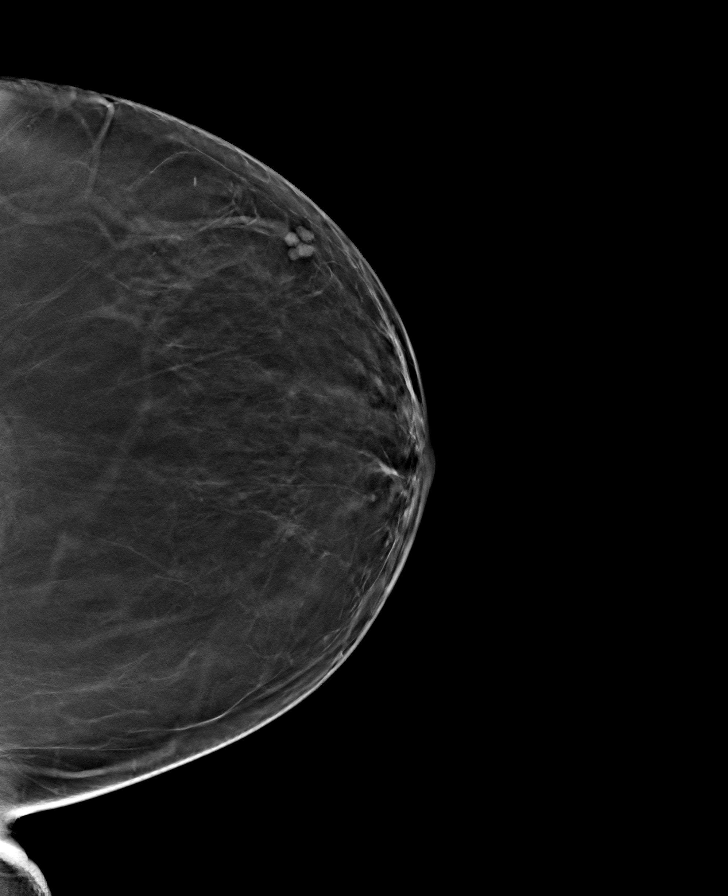

[R MLO tomo · tomo slice 46/91.0]
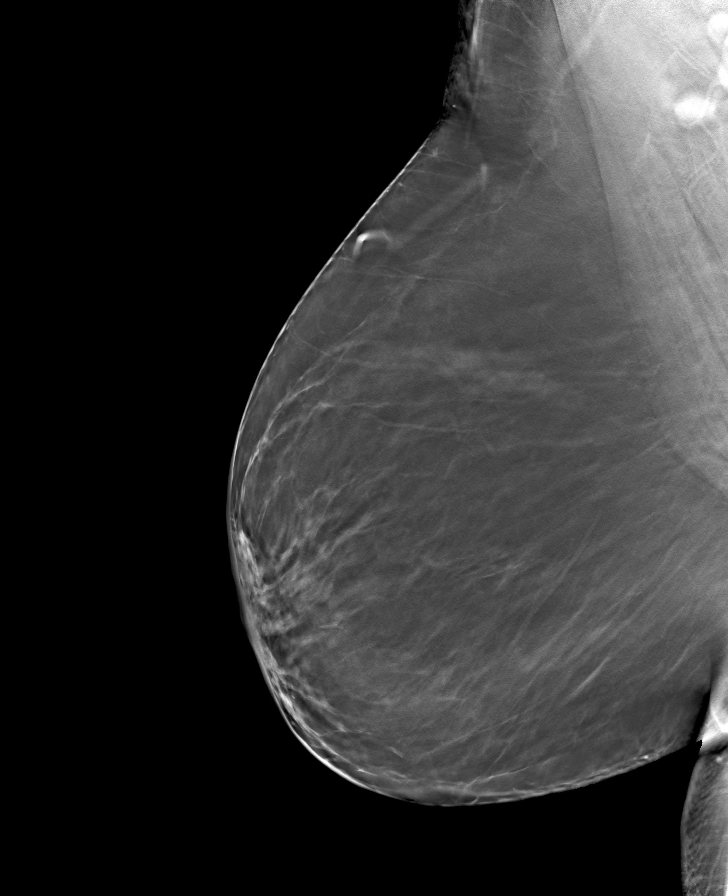

[R CC tomo · tomo slice 39/77.0]
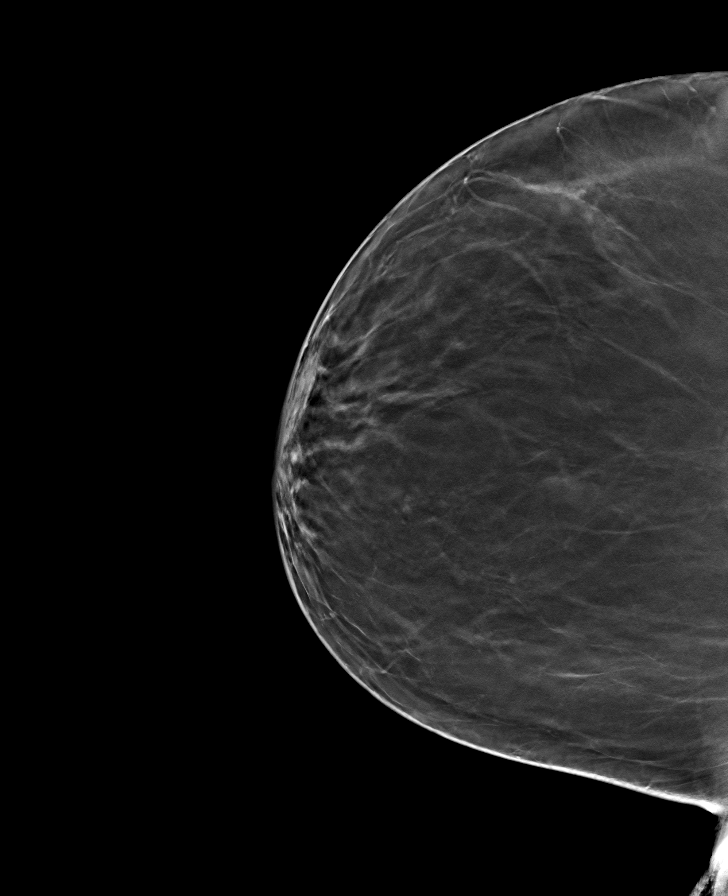

[L MLO tomo · tomo slice 45/88.0]
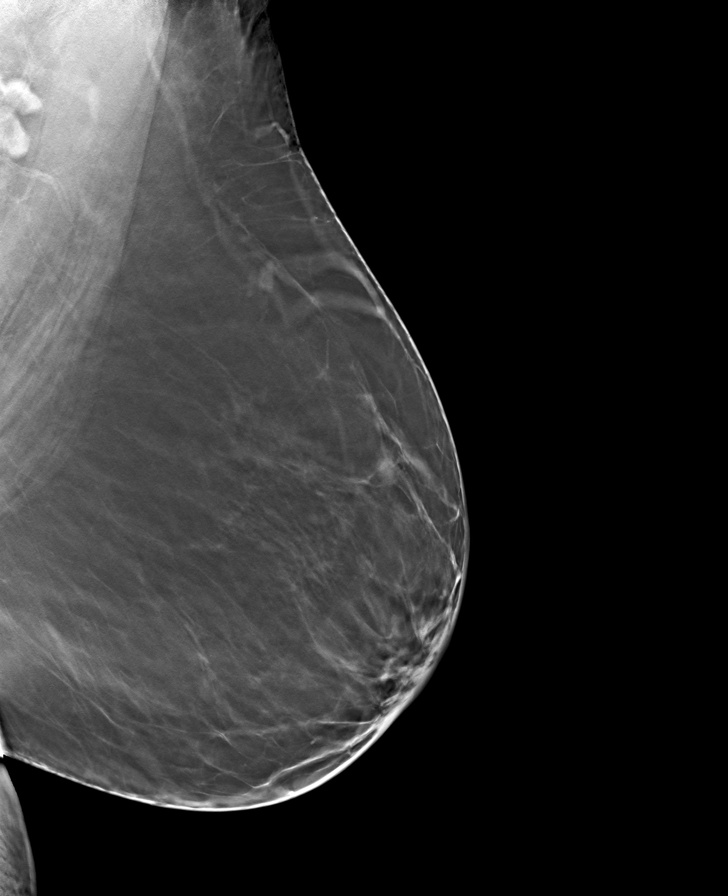

[8 of 24 positions shown; findings below may reference images not displayed]

ACR Breast Density Category b: There are scattered areas of
fibroglandular density.
FINDINGS: There are no findings suspicious for malignancy.
IMPRESSION: No mammographic evidence of malignancy. A result letter of this
screening mammogram will be mailed directly to the patient.

RECOMMENDATION:
Screening mammogram in one year. (Code:51-O-LD2)

BI-RADS CATEGORY  1: Negative.

## 2022-05-31 ENCOUNTER — Ambulatory Visit
Admission: RE | Admit: 2022-05-31 | Discharge: 2022-05-31 | Disposition: A | Discharge status: Home / Self Care | Payer: Managed Care, Other (non HMO) | Source: Ambulatory Visit | Attending: Obstetrics and Gynecology | Admitting: Obstetrics and Gynecology

## 2022-05-31 DIAGNOSIS — Z1231 Encounter for screening mammogram for malignant neoplasm of breast: Secondary | ICD-10-CM | POA: Insufficient documentation

## 2022-08-11 ENCOUNTER — Other Ambulatory Visit: Payer: Self-pay | Admitting: Nurse Practitioner

## 2022-08-11 DIAGNOSIS — F411 Generalized anxiety disorder: Secondary | ICD-10-CM

## 2022-08-11 NOTE — Telephone Encounter (Signed)
Unable to refill per protocol, Rx request is too soon. Last refill 03/29/22 for 90 and 1 refill.  Requested Prescriptions  Pending Prescriptions Disp Refills   hydrOXYzine (ATARAX) 10 MG tablet [Pharmacy Med Name: HYDROXYZINE HCL 10 MG TABLET] 180 tablet 2    Sig: TAKE 1 TABLET (10 MG TOTAL) BY MOUTH 2 (TWO) TIMES DAILY AS NEEDED (MAY MAKE DROWSY).     Ear, Nose, and Throat:  Antihistamines 2 Failed - 08/11/2022  2:31 PM      Failed - Cr in normal range and within 360 days    Creat  Date Value Ref Range Status  12/17/2020 0.61 0.50 - 1.05 mg/dL Final    Comment:    For patients >47 years of age, the reference limit for Creatinine is approximately 13% higher for people identified as African-American. .          Failed - Valid encounter within last 12 months    Recent Outpatient Visits           1 year ago Major depressive disorder with current active episode, moderate depression episode severity, recurrent   [place], Julie F, FNP   1 year ago Depression with anxiety   [facility] Kathrine Haddock, NP   2 years ago Essential hypertension   [facility] Towanda Malkin, MD   2 years ago Essential hypertension   [facility] Towanda Malkin, MD   2 years ago Essential hypertension   [facility] Towanda Malkin, MD

## 2022-09-11 ENCOUNTER — Other Ambulatory Visit: Payer: Self-pay | Admitting: Nurse Practitioner

## 2022-09-11 DIAGNOSIS — F411 Generalized anxiety disorder: Secondary | ICD-10-CM

## 2022-09-12 NOTE — Telephone Encounter (Signed)
Requested medications are due for refill today.  A little too soon  Requested medications are on the active medications list.  yes  Last refill. 03/29/2022 #180 1 rf  Future visit scheduled.   no  Notes to clinic.  Labs are expired. PT needs an appt.    Requested Prescriptions  Pending Prescriptions Disp Refills   hydrOXYzine (ATARAX) 10 MG tablet [Pharmacy Med Name: HYDROXYZINE HCL 10 MG TABLET] 60 tablet 5    Sig: TAKE 1 TABLET (10 MG TOTAL) BY MOUTH 2 (TWO) TIMES DAILY AS NEEDED (MAY MAKE DROWSY).     Ear, Nose, and Throat:  Antihistamines 2 Failed - 09/11/2022  8:41 AM      Failed - Cr in normal range and within 360 days    Creat  Date Value Ref Range Status  12/17/2020 0.61 0.50 - 1.05 mg/dL Final    Comment:    For patients >42 years of age, the reference limit for Creatinine is approximately 13% higher for people identified as African-American. .          Failed - Valid encounter within last 12 months    Recent Outpatient Visits           1 year ago Major depressive disorder with current active episode, moderate depression episode severity, recurrent   [place], Lauren F, FNP   1 year ago Depression with anxiety   [facility] Kathrine Haddock, NP   2 years ago Essential hypertension   [facility] Towanda Malkin, MD   2 years ago Essential hypertension   [facility] Towanda Malkin, MD   2 years ago Essential hypertension   [facility] Towanda Malkin, MD

## 2022-09-13 NOTE — Telephone Encounter (Signed)
Please schedule patient appointment. It has been a year since seen

## 2022-09-13 NOTE — Telephone Encounter (Signed)
Pt refused appt at this time stated she does not need this medication refilled

## 2022-12-12 ENCOUNTER — Other Ambulatory Visit: Payer: Self-pay | Admitting: Nurse Practitioner

## 2022-12-12 DIAGNOSIS — I10 Essential (primary) hypertension: Secondary | ICD-10-CM

## 2022-12-12 NOTE — Telephone Encounter (Signed)
Requested Prescriptions  Refused Prescriptions Disp Refills   lisinopril (ZESTRIL) 10 MG tablet [Pharmacy Med Name: LISINOPRIL 10 MG TABLET] 30 tablet 11    Sig: TAKE 1 TABLET BY MOUTH EVERY DAY     Cardiovascular:  ACE Inhibitors Failed - 12/12/2022  7:42 AM      Failed - Cr in normal range and within 180 days    Creat  Date Value Ref Range Status  12/17/2020 0.61 0.50 - 1.05 mg/dL Final    Comment:    For patients >61 years of age, the reference limit for Creatinine is approximately 13% higher for people identified as African-American. .          Failed - K in normal range and within 180 days    Potassium  Date Value Ref Range Status  12/17/2020 4.2 3.5 - 5.3 mmol/L Final         Failed - Valid encounter within last 6 months    Recent Outpatient Visits           1 year ago Major depressive disorder with current active episode, moderate depression episode severity, recurrent   [facility] Berniece Salines, FNP   1 year ago Depression with anxiety   [facility] Gabriel Cirri, NP   2 years ago Essential hypertension   [place] [facility] Jamelle Haring, MD   2 years ago Essential hypertension   [place] [facility] Jamelle Haring, MD   2 years ago Essential hypertension   [facility] Jamelle Haring, MD              Passed - Patient is not pregnant      Passed - Last BP in normal range    BP Readings from Last 1 Encounters:  04/21/22 120/70

## 2023-01-15 ENCOUNTER — Other Ambulatory Visit: Payer: Self-pay | Admitting: Nurse Practitioner

## 2023-01-15 DIAGNOSIS — F411 Generalized anxiety disorder: Secondary | ICD-10-CM

## 2023-01-17 NOTE — Telephone Encounter (Signed)
Unable to refill per protocol, appointment needed. Patient sees another PCP listed in chart.  Requested Prescriptions  Pending Prescriptions Disp Refills   hydrOXYzine (ATARAX) 10 MG tablet [Pharmacy Med Name: HYDROXYZINE HCL 10 MG TABLET] 60 tablet 5    Sig: TAKE 1 TABLET (10 MG TOTAL) BY MOUTH 2 (TWO) TIMES DAILY AS NEEDED (MAY MAKE DROWSY).     Ear, Nose, and Throat:  Antihistamines 2 Failed - 01/15/2023 11:09 PM      Failed - Cr in normal range and within 360 days    Creat  Date Value Ref Range Status  12/17/2020 0.61 0.50 - 1.05 mg/dL Final    Comment:    For patients >69 years of age, the reference limit for Creatinine is approximately 13% higher for people identified as African-American. .          Failed - Valid encounter within last 12 months    Recent Outpatient Visits           1 year ago Major depressive disorder with current active episode, moderate depression episode severity, recurrent   [facility] Berniece Salines, FNP   2 years ago Depression with anxiety   [facility] Gabriel Cirri, NP   2 years ago Essential hypertension   [place] [facility] Jamelle Haring, MD   2 years ago Essential hypertension   [facility] Jamelle Haring, MD   2 years ago Essential hypertension   [facility] Jamelle Haring, MD

## 2023-01-17 NOTE — Telephone Encounter (Signed)
Lvm to call and schedule appt for med refill on medication

## 2023-03-10 ENCOUNTER — Other Ambulatory Visit: Payer: Self-pay | Admitting: Internal Medicine

## 2023-03-10 DIAGNOSIS — Z1231 Encounter for screening mammogram for malignant neoplasm of breast: Secondary | ICD-10-CM

## 2023-05-14 NOTE — Progress Notes (Unsigned)
PCP: Berniece Salines, FNP   No chief complaint on file.    HPI:      [name] is a 62 y.o. [phone] who LMP was No LMP recorded. Patient is postmenopausal., presents today for her annual examination.  Her menses are absent due to menopause. She does not have PMB. She does have infrequent vasomotor sx.   Sex activity: single partner, contraception - post menopausal status. She does have vaginal dryness and uses lubricants and estrace crm 1 g wkly with some sx improvement. Still has some pain, hasn't tried coconut oil.   Last Pap: 02/17/21  Results were: no abnormalities /neg HPV DNA.  Hx of STDs: none  Last mammogram: 05/31/22 Results were: normal--routine follow-up in 12 months. Has appt 12/24 There is no FH of breast cancer. There is a FH of ovarian cancer in her PGM and colon cancer in her father. Pt is MyRisk neg 2016.  The patient does self-breast exams.  Colonoscopy: [facility]  Repeat due after 5 years due to hx of polyps in past/FH colon cancer.  Tobacco use: The patient denies current or previous tobacco use. Alcohol use: few drinks wkly No drug use  Exercise: moderately active  She does get adequate calcium and Vitamin D in her diet.  Labs and meds with PCP.  Past Medical History:  Diagnosis Date   Allergic rhinitis    Asthma    BRCA negative 2016   MyRisk neg   Essential hypertension    Family history of ovarian cancer 2016   MyRisk neg   Pre-diabetes 2019    Past Surgical History:  Procedure Laterality Date   CESAREAN SECTION  1991/1992   COLONOSCOPY  2018   [facility], repeat after 5 yrs   LYMPHADENECTOMY  1985   TMJ ARTHROPLASTY  1989    Family History  Problem Relation Age of Onset   Hypertension Mother    Prostate cancer Father    Skin cancer Father    Colon cancer Father    Hypertension Father    Hypertension Sister    Skin cancer Brother    Lung cancer Maternal Grandfather    Ovarian cancer Paternal  Grandmother    Breast cancer Neg Hx     Social History   Socioeconomic History   Marital status: Married    Spouse name: Not on file   Number of children: Not on file   Years of education: Not on file   Highest education level: Not on file  Occupational History   Not on file  Tobacco Use   Smoking status: Former   Smokeless tobacco: Never   Tobacco comments:    quit ~ 2000  Vaping Use   Vaping status: Never Used  Substance and Sexual Activity   Alcohol use: Yes    Comment: ocasionally    Drug use: Never   Sexual activity: Yes    Birth control/protection: Post-menopausal  Other Topics Concern   Not on file  Social History Narrative   Married.   2 children.    Lives in [place].    Enjoys traveling.    Social Determinants of Health   Financial Resource Strain: Low Risk  (03/09/2023)   Received from [facility]   Overall Financial Resource Strain (CARDIA)    Difficulty of Paying Living Expenses: Not hard at all  Food Insecurity: No Food Insecurity (03/09/2023)   [facility] from [facility]   Hunger  Vital Sign    Worried About Programme researcher, broadcasting/film/video in the Last Year: Never true    Ran Out of Food in the Last Year: Never true  Transportation Needs: No Transportation Needs (03/09/2023)   Received from [facility] - Transportation    In the past 12 months, has lack of transportation kept you from medical appointments or from getting medications?: No    Lack of Transportation (Non-Medical): No  Physical Activity: Not on file  Stress: Not on file  Social Connections: Not on file  Intimate Partner Violence: Not on file    Outpatient Medications Prior to Visit  Medication Sig Dispense Refill   albuterol (VENTOLIN HFA) 108 (90 Base) MCG/ACT inhaler Inhale into the lungs every 4 (four) hours as needed.      benzonatate (TESSALON) 100 MG capsule Take 100 mg by mouth 3 (three) times daily as needed.     escitalopram  (LEXAPRO) 10 MG tablet Take 1 tablet (10 mg total) by mouth daily. 90 tablet 1   estradiol (ESTRACE) 0.1 MG/GM vaginal cream Insert 1g once weekly as maintenance 42.5 g 1   fluticasone (FLONASE) 50 MCG/ACT nasal spray SMARTSIG:1 Spray(s) Both Nares Twice Daily PRN     hydrOXYzine (ATARAX) 10 MG tablet TAKE 1 TABLET (10 MG TOTAL) BY MOUTH 2 (TWO) TIMES DAILY AS NEEDED (MAY MAKE DROWSY). 180 tablet 1   lisinopril (ZESTRIL) 10 MG tablet TAKE 1 TABLET BY MOUTH EVERY DAY 30 tablet 11   RETIN-A MICRO PUMP 0.08 % GEL Apply topically at bedtime.     No facility-administered medications prior to visit.    ROS:  Review of Systems  Constitutional:  Negative for fatigue, fever and unexpected weight change.  Respiratory:  Positive for cough and wheezing. Negative for shortness of breath.   Cardiovascular:  Negative for chest pain, palpitations and leg swelling.  Gastrointestinal:  Negative for blood in stool, constipation, diarrhea, nausea and vomiting.  Endocrine: Negative for cold intolerance, heat intolerance and polyuria.  Genitourinary:  Negative for dyspareunia, dysuria, flank pain, frequency, genital sores, hematuria, menstrual problem, pelvic pain, urgency, vaginal bleeding, vaginal discharge and vaginal pain.  Musculoskeletal:  Negative for back pain, joint swelling and myalgias.  Skin:  Negative for rash.  Neurological:  Negative for dizziness, syncope, light-headedness, numbness and headaches.  Hematological:  Negative for adenopathy.  Psychiatric/Behavioral:  Positive for agitation. Negative for confusion, sleep disturbance and suicidal ideas. The patient is not nervous/anxious.   BREAST: No symptoms    Objective: There were no vitals taken for this visit.   Physical Exam Constitutional:      Appearance: She is well-developed.  Genitourinary:     Vulva normal.     Right Labia: No rash, tenderness or lesions.    Left Labia: No tenderness, lesions or rash.    No vaginal discharge,  erythema or tenderness.      Right Adnexa: not tender and no mass present.    Left Adnexa: not tender and no mass present.    No cervical motion tenderness, friability or polyp.     Uterus is not enlarged or tender.  Breasts:    Right: No mass, nipple discharge, skin change or tenderness.     Left: No mass, nipple discharge, skin change or tenderness.  Neck:     Thyroid: No thyromegaly.  Cardiovascular:     Rate and Rhythm: Normal rate and regular rhythm.     Heart sounds: Normal heart sounds. No murmur  heard. Pulmonary:     Effort: Pulmonary effort is normal.     Breath sounds: Normal breath sounds. No stridor or decreased air movement. No wheezing or rhonchi.  Abdominal:     Palpations: Abdomen is soft.     Tenderness: There is no abdominal tenderness. There is no guarding or rebound.  Musculoskeletal:        General: Normal range of motion.     Cervical back: Normal range of motion.  Lymphadenopathy:     Cervical: No cervical adenopathy.  Neurological:     General: No focal deficit present.     Mental Status: She is alert and oriented to person, place, and time.     Cranial Nerves: No cranial nerve deficit.  Skin:    General: Skin is warm and dry.  Psychiatric:        Mood and Affect: Mood normal.        Behavior: Behavior normal.        Thought Content: Thought content normal.        Judgment: Judgment normal.  Vitals reviewed.     Assessment/Plan: Encounter for annual routine gynecological examination  Encounter for screening mammogram for malignant neoplasm of breast - Plan: MM 3D SCREEN BREAST BILATERAL; pt to schedule mammo  Screening for colon cancer--has appt 12/23  Postmenopausal atrophic vaginitis - Plan: estradiol (ESTRACE) 0.1 MG/GM vaginal cream; Rx RF. Add coconut oil. F/u prn.   Dyspareunia in female - Plan: estradiol (ESTRACE) 0.1 MG/GM vaginal cream    No orders of the defined types were placed in this encounter.           GYN counsel breast  self exam, mammography screening, menopause, adequate intake of calcium and vitamin D, diet and exercise    F/U  No follow-ups on file.  Lindaann Gradilla B. Willene Holian, PA-C 05/14/2023 5:15 PM

## 2023-05-16 ENCOUNTER — Ambulatory Visit (INDEPENDENT_AMBULATORY_CARE_PROVIDER_SITE_OTHER): Payer: Managed Care, Other (non HMO) | Admitting: Obstetrics and Gynecology

## 2023-05-16 ENCOUNTER — Encounter: Payer: Self-pay | Admitting: Obstetrics and Gynecology

## 2023-05-16 VITALS — BP 108/70 | HR 92 | Ht 63.0 in | Wt 183.0 lb

## 2023-05-16 DIAGNOSIS — Z1231 Encounter for screening mammogram for malignant neoplasm of breast: Secondary | ICD-10-CM

## 2023-05-16 DIAGNOSIS — Z01419 Encounter for gynecological examination (general) (routine) without abnormal findings: Secondary | ICD-10-CM

## 2023-05-16 DIAGNOSIS — N952 Postmenopausal atrophic vaginitis: Secondary | ICD-10-CM

## 2023-05-16 DIAGNOSIS — N941 Unspecified dyspareunia: Secondary | ICD-10-CM

## 2023-05-16 MED ORDER — ESTRADIOL 0.1 MG/GM VA CREA: TOPICAL_CREAM | VAGINAL | 1 refills | Status: DC

## 2023-05-16 NOTE — Patient Instructions (Signed)
I value your feedback and you entrusting us with your care. If you get a [facility] patient survey, I would appreciate you taking the time to let us know about your experience today. Thank you! ? ? ?

## 2023-05-24 ENCOUNTER — Other Ambulatory Visit: Payer: Self-pay | Admitting: Medical Genetics

## 2023-05-29 ENCOUNTER — Other Ambulatory Visit
Admission: RE | Admit: 2023-05-29 | Discharge: 2023-05-29 | Disposition: A | Discharge status: Home / Self Care | Payer: Self-pay | Source: Ambulatory Visit | Attending: Medical Genetics | Admitting: Medical Genetics

## 2023-05-31 ENCOUNTER — Other Ambulatory Visit: Payer: Self-pay | Admitting: Medical Genetics

## 2023-06-01 ENCOUNTER — Other Ambulatory Visit
Admission: RE | Admit: 2023-06-01 | Discharge: 2023-06-01 | Disposition: A | Discharge status: Home / Self Care | Payer: Managed Care, Other (non HMO) | Source: Ambulatory Visit | Attending: Medical Genetics | Admitting: Medical Genetics

## 2023-06-02 ENCOUNTER — Ambulatory Visit
Admission: RE | Admit: 2023-06-02 | Discharge: 2023-06-02 | Disposition: A | Discharge status: Home / Self Care | Payer: Managed Care, Other (non HMO) | Source: Ambulatory Visit | Attending: Internal Medicine | Admitting: Internal Medicine

## 2023-06-02 DIAGNOSIS — Z1231 Encounter for screening mammogram for malignant neoplasm of breast: Secondary | ICD-10-CM | POA: Diagnosis present

## 2023-06-13 LAB — GENECONNECT MOLECULAR SCREEN: Genetic Analysis Overall Interpretation: NEGATIVE

## 2023-07-22 ENCOUNTER — Other Ambulatory Visit: Payer: Self-pay | Admitting: Obstetrics and Gynecology

## 2023-07-22 DIAGNOSIS — N941 Unspecified dyspareunia: Secondary | ICD-10-CM

## 2023-07-22 DIAGNOSIS — N952 Postmenopausal atrophic vaginitis: Secondary | ICD-10-CM

## 2023-08-28 ENCOUNTER — Other Ambulatory Visit: Payer: Self-pay | Admitting: Obstetrics and Gynecology

## 2023-08-28 DIAGNOSIS — N941 Unspecified dyspareunia: Secondary | ICD-10-CM

## 2023-08-28 DIAGNOSIS — N952 Postmenopausal atrophic vaginitis: Secondary | ICD-10-CM

## 2023-10-30 ENCOUNTER — Other Ambulatory Visit: Payer: Self-pay | Admitting: Obstetrics and Gynecology

## 2023-10-30 DIAGNOSIS — N941 Unspecified dyspareunia: Secondary | ICD-10-CM

## 2023-10-30 DIAGNOSIS — N952 Postmenopausal atrophic vaginitis: Secondary | ICD-10-CM

## 2023-12-07 ENCOUNTER — Encounter: Payer: Self-pay | Admitting: Obstetrics and Gynecology

## 2023-12-07 DIAGNOSIS — N6320 Unspecified lump in the left breast, unspecified quadrant: Secondary | ICD-10-CM

## 2023-12-11 NOTE — Telephone Encounter (Signed)
 Pls f/u with [facility] re: scheduling this pt. She scanned all the images and info into her MyChart so I can't send it to them but they can see them. She should prob take the images with her as well.

## 2023-12-12 NOTE — Telephone Encounter (Signed)
 Patient aware of info

## 2023-12-14 ENCOUNTER — Other Ambulatory Visit: Payer: Self-pay | Admitting: *Deleted

## 2023-12-14 ENCOUNTER — Inpatient Hospital Stay
Admission: RE | Admit: 2023-12-14 | Discharge: 2023-12-14 | Disposition: A | Discharge status: Home / Self Care | Payer: Self-pay | Source: Ambulatory Visit | Attending: Obstetrics and Gynecology | Admitting: Obstetrics and Gynecology

## 2023-12-14 DIAGNOSIS — Z1231 Encounter for screening mammogram for malignant neoplasm of breast: Secondary | ICD-10-CM

## 2023-12-20 ENCOUNTER — Ambulatory Visit: Payer: Self-pay | Admitting: Obstetrics and Gynecology

## 2023-12-20 ENCOUNTER — Ambulatory Visit
Admission: RE | Admit: 2023-12-20 | Discharge: 2023-12-20 | Disposition: A | Discharge status: Home / Self Care | Source: Ambulatory Visit | Attending: Obstetrics and Gynecology | Admitting: Obstetrics and Gynecology

## 2023-12-20 DIAGNOSIS — N6320 Unspecified lump in the left breast, unspecified quadrant: Secondary | ICD-10-CM | POA: Diagnosis present

## 2024-04-19 ENCOUNTER — Other Ambulatory Visit: Payer: Self-pay | Admitting: Internal Medicine

## 2024-04-19 DIAGNOSIS — Z1231 Encounter for screening mammogram for malignant neoplasm of breast: Secondary | ICD-10-CM

## 2024-05-26 NOTE — Progress Notes (Unsigned)
 PCP: Auston Reyes BIRCH, MD   No chief complaint on file.    HPI:      [name] is a 63 y.o. [phone] who LMP was No LMP recorded. Patient is postmenopausal., presents today for her annual examination.  Her menses are absent due to menopause. She does not have PMB. She does have infrequent vasomotor sx.   Sex activity: single partner, occas, husband with recent prostate cancer surg; contraception - post menopausal status. She does have vaginal dryness and uses lubricants and estrace  crm 1 g wkly with some sx improvement. Still has some pain, hasn't tried coconut oil.   Last Pap: 02/17/21  Results were: no abnormalities /neg HPV DNA.  Hx of STDs: none  Last mammogram: 06/02/23 Results were: normal--routine follow-up in 12 months. Has appt 12/25; had neg LT breast mammo after abnormal HER scan 6/25. There is no FH of breast cancer. There is a FH of ovarian cancer in her PGM and colon cancer in her father. Pt is MyRisk neg 2016.  The patient does self-breast exams.  Colonoscopy: colonoscopy 1/24 at [facility]; Repeat due after 5 years due to hx of polyps/FH colon cancer.  Tobacco use: The patient denies current or previous tobacco use. Alcohol use: none No drug use  Exercise: moderately active  She does get adequate calcium and Vitamin D in her diet.  Labs and meds with PCP.  Past Medical History:  Diagnosis Date   Allergic rhinitis    Asthma    BRCA negative 2016   MyRisk neg   Essential hypertension    Family history of ovarian cancer 2016   MyRisk neg   Pre-diabetes 2019    Past Surgical History:  Procedure Laterality Date   CESAREAN SECTION  1991/1992   COLONOSCOPY  2018   [facility], repeat after 5 yrs   LYMPHADENECTOMY  1985   TMJ ARTHROPLASTY  1989    Family History  Problem Relation Age of Onset   Hypertension Mother    Prostate cancer Father    Skin cancer Father    Colon cancer Father    Hypertension Father    Melanoma Father    Hypertension Sister     Lung cancer Maternal Grandfather    Ovarian cancer Paternal Grandmother    Skin cancer Brother    Breast cancer Niece     Social History   Socioeconomic History   Marital status: Married    Spouse name: Not on file   Number of children: Not on file   Years of education: Not on file   Highest education level: Not on file  Occupational History   Not on file  Tobacco Use   Smoking status: Former    Types: Cigarettes   Smokeless tobacco: Never   Tobacco comments:    quit ~ 2000  Vaping Use   Vaping status: Never Used  Substance and Sexual Activity   Alcohol use: Yes    Comment: ocasionally    Drug use: Never   Sexual activity: Yes    Birth control/protection: Post-menopausal  Other Topics Concern   Not on file  Social History Narrative   Married.   2 children.    Lives in [place].    Enjoys traveling.    Social Drivers of Corporate Investment Banker Strain: Low Risk  (07/03/2023)   Received from [facility]   Overall Financial Resource Strain (CARDIA)    Difficulty of Paying Living Expenses: Not hard at  all  Food Insecurity: No Food Insecurity (07/03/2023)   Received [facility]   Hunger Vital Sign    Within the past 12 months, you worried that your food would run out before you got the money to buy more.: Never true    Within the past 12 months, the food you bought just didn't last and you didn't have money to get more.: Never true  Transportation Needs: No Transportation Needs (07/03/2023)   Received from [facility] - Transportation    In the past 12 months, has lack of transportation kept you from medical appointments or from getting medications?: No    Lack of Transportation (Non-Medical): No  Physical Activity: Not on file  Stress: Not on file  Social Connections: Not on file  Intimate Partner Violence: Not on file    Outpatient Medications Prior to Visit  Medication Sig Dispense Refill    albuterol (VENTOLIN HFA) 108 (90 Base) MCG/ACT inhaler Inhale into the lungs every 4 (four) hours as needed.      escitalopram  (LEXAPRO ) 10 MG tablet Take 1 tablet (10 mg total) by mouth daily. 90 tablet 1   estradiol  (ESTRACE ) 0.1 MG/GM vaginal cream Insert 1g 1-2 times weekly as maintenance 42.5 g 1   fluticasone (FLONASE) 50 MCG/ACT nasal spray SMARTSIG:1 Spray(s) Both Nares Twice Daily PRN     hydrOXYzine  (ATARAX ) 10 MG tablet TAKE 1 TABLET (10 MG TOTAL) BY MOUTH 2 (TWO) TIMES DAILY AS NEEDED (MAY MAKE DROWSY). 180 tablet 1   lisinopril  (ZESTRIL ) 10 MG tablet TAKE 1 TABLET BY MOUTH EVERY DAY 30 tablet 11   metFORMIN (GLUCOPHAGE) 1000 MG tablet Take 1,000 mg by mouth 2 (two) times daily. (Patient not taking: Reported on 05/16/2023)     metFORMIN (GLUCOPHAGE) 500 MG tablet Take 500 mg by mouth 2 (two) times daily.     phentermine (ADIPEX-P) 37.5 MG tablet Take 37.5 mg by mouth every morning.     RETIN-A MICRO PUMP 0.08 % GEL Apply topically at bedtime.     topiramate (TOPAMAX) 50 MG tablet Take 100 mg by mouth at bedtime.     No facility-administered medications prior to visit.    ROS:  Review of Systems  Constitutional:  Negative for fatigue, fever and unexpected weight change.  Respiratory:  Negative for cough, shortness of breath and wheezing.   Cardiovascular:  Negative for chest pain, palpitations and leg swelling.  Gastrointestinal:  Negative for blood in stool, constipation, diarrhea, nausea and vomiting.  Endocrine: Negative for cold intolerance, heat intolerance and polyuria.  Genitourinary:  Negative for dyspareunia, dysuria, flank pain, frequency, genital sores, hematuria, menstrual problem, pelvic pain, urgency, vaginal bleeding, vaginal discharge and vaginal pain.  Musculoskeletal:  Negative for back pain, joint swelling and myalgias.  Skin:  Negative for rash.  Neurological:  Negative for dizziness, syncope, light-headedness, numbness and headaches.  Hematological:   Negative for adenopathy.  Psychiatric/Behavioral:  Negative for agitation, confusion, sleep disturbance and suicidal ideas. The patient is not nervous/anxious.   BREAST: No symptoms    Objective: There were no vitals taken for this visit.   Physical Exam Constitutional:      Appearance: She is well-developed.  Genitourinary:     Vulva normal.     Right Labia: No rash, tenderness or lesions.    Left Labia: No tenderness, lesions or rash.    No vaginal discharge, erythema or tenderness.      Right Adnexa: not tender and no mass  present.    Left Adnexa: not tender and no mass present.    No cervical motion tenderness, friability or polyp.     Uterus is not enlarged or tender.  Breasts:    Right: No mass, nipple discharge, skin change or tenderness.     Left: No mass, nipple discharge, skin change or tenderness.  Neck:     Thyroid: No thyromegaly.  Cardiovascular:     Rate and Rhythm: Normal rate and regular rhythm.     Heart sounds: Normal heart sounds. No murmur heard. Pulmonary:     Effort: Pulmonary effort is normal.     Breath sounds: Normal breath sounds. No stridor or decreased air movement. No wheezing or rhonchi.  Abdominal:     Palpations: Abdomen is soft.     Tenderness: There is no abdominal tenderness. There is no guarding or rebound.  Musculoskeletal:        General: Normal range of motion.     Cervical back: Normal range of motion.  Lymphadenopathy:     Cervical: No cervical adenopathy.  Neurological:     General: No focal deficit present.     Mental Status: She is alert and oriented to person, place, and time.     Cranial Nerves: No cranial nerve deficit.  Skin:    General: Skin is warm and dry.  Psychiatric:        Mood and Affect: Mood normal.        Behavior: Behavior normal.        Thought Content: Thought content normal.        Judgment: Judgment normal.  Vitals reviewed.     Assessment/Plan: Encounter for annual routine gynecological  examination  Encounter for screening mammogram for malignant neoplasm of breast; pt has mammo appt  Postmenopausal atrophic vaginitis - Plan: estradiol  (ESTRACE ) 0.1 MG/GM vaginal cream; Rx RF eRxd.  Dyspareunia in female - Plan: estradiol  (ESTRACE ) 0.1 MG/GM vaginal cream; can do 1-3 times weekly prn, try coconut oil. F/u prn.   No orders of the defined types were placed in this encounter.           GYN counsel breast self exam, mammography screening, menopause, adequate intake of calcium and vitamin D, diet and exercise    F/U  No follow-ups on file.  Kiaira Pointer B. Alva Broxson, PA-C 05/26/2024 6:11 PM

## 2024-05-27 ENCOUNTER — Ambulatory Visit: Admitting: Obstetrics and Gynecology

## 2024-05-27 ENCOUNTER — Encounter: Payer: Self-pay | Admitting: Obstetrics and Gynecology

## 2024-05-27 ENCOUNTER — Other Ambulatory Visit (HOSPITAL_COMMUNITY)
Admission: RE | Admit: 2024-05-27 | Discharge: 2024-05-27 | Disposition: A | Source: Ambulatory Visit | Attending: Obstetrics and Gynecology | Admitting: Obstetrics and Gynecology

## 2024-05-27 VITALS — BP 132/70 | HR 90 | Ht 63.0 in | Wt 167.0 lb

## 2024-05-27 DIAGNOSIS — Z124 Encounter for screening for malignant neoplasm of cervix: Secondary | ICD-10-CM | POA: Diagnosis present

## 2024-05-27 DIAGNOSIS — Z1151 Encounter for screening for human papillomavirus (HPV): Secondary | ICD-10-CM | POA: Diagnosis present

## 2024-05-27 DIAGNOSIS — N952 Postmenopausal atrophic vaginitis: Secondary | ICD-10-CM

## 2024-05-27 DIAGNOSIS — Z01419 Encounter for gynecological examination (general) (routine) without abnormal findings: Secondary | ICD-10-CM | POA: Diagnosis not present

## 2024-05-27 DIAGNOSIS — N941 Unspecified dyspareunia: Secondary | ICD-10-CM | POA: Diagnosis not present

## 2024-05-27 DIAGNOSIS — Z1231 Encounter for screening mammogram for malignant neoplasm of breast: Secondary | ICD-10-CM

## 2024-05-27 MED ORDER — ESTRADIOL 0.01 % VA CREA: TOPICAL_CREAM | VAGINAL | 1 refills | Status: AC

## 2024-05-27 NOTE — Patient Instructions (Signed)
 I value your feedback and you entrusting [place] with your care. If you get a [facility] patient survey, I would appreciate you taking the time to let us know about your experience today. Thank you! ? ? ?

## 2024-05-29 LAB — CYTOLOGY - PAP
Adequacy: ABSENT
Comment: NEGATIVE
Diagnosis: NEGATIVE
High risk HPV: NEGATIVE

## 2024-06-03 ENCOUNTER — Ambulatory Visit
Admission: RE | Admit: 2024-06-03 | Discharge: 2024-06-03 | Disposition: A | Source: Ambulatory Visit | Attending: Internal Medicine | Admitting: Internal Medicine

## 2024-06-03 DIAGNOSIS — Z1231 Encounter for screening mammogram for malignant neoplasm of breast: Secondary | ICD-10-CM
